# Patient Record
Sex: Male | Born: 1970 | Race: Black or African American | Hispanic: No | Smoking: Never smoker
Health system: Southern US, Community
[De-identification: ages and names within clinical notes are randomized; demographics above are authoritative.]

## PROBLEM LIST (undated history)

## (undated) DIAGNOSIS — I10 Essential (primary) hypertension: Secondary | ICD-10-CM

## (undated) DIAGNOSIS — F419 Anxiety disorder, unspecified: Secondary | ICD-10-CM

## (undated) DIAGNOSIS — F329 Major depressive disorder, single episode, unspecified: Secondary | ICD-10-CM

## (undated) DIAGNOSIS — J45909 Unspecified asthma, uncomplicated: Secondary | ICD-10-CM

## (undated) DIAGNOSIS — I499 Cardiac arrhythmia, unspecified: Secondary | ICD-10-CM

## (undated) DIAGNOSIS — G4730 Sleep apnea, unspecified: Secondary | ICD-10-CM

## (undated) DIAGNOSIS — F32A Depression, unspecified: Secondary | ICD-10-CM

## (undated) HISTORY — DX: Unspecified asthma, uncomplicated: J45.909

## (undated) HISTORY — DX: Cardiac arrhythmia, unspecified: I49.9

## (undated) HISTORY — DX: Anxiety disorder, unspecified: F41.9

## (undated) HISTORY — DX: Sleep apnea, unspecified: G47.30

---

## 2000-03-22 ENCOUNTER — Inpatient Hospital Stay (HOSPITAL_COMMUNITY): Admission: EM | Admit: 2000-03-22 | Discharge: 2000-03-23 | Payer: Self-pay | Admitting: *Deleted

## 2000-09-10 ENCOUNTER — Emergency Department (HOSPITAL_COMMUNITY): Admission: EM | Admit: 2000-09-10 | Discharge: 2000-09-10 | Payer: Self-pay | Admitting: Emergency Medicine

## 2002-10-30 ENCOUNTER — Ambulatory Visit (HOSPITAL_BASED_OUTPATIENT_CLINIC_OR_DEPARTMENT_OTHER): Admission: RE | Admit: 2002-10-30 | Discharge: 2002-10-30 | Payer: Self-pay | Admitting: Internal Medicine

## 2004-01-07 ENCOUNTER — Emergency Department (HOSPITAL_COMMUNITY): Admission: EM | Admit: 2004-01-07 | Discharge: 2004-01-07 | Payer: Self-pay | Admitting: Emergency Medicine

## 2004-06-20 ENCOUNTER — Emergency Department (HOSPITAL_COMMUNITY): Admission: EM | Admit: 2004-06-20 | Discharge: 2004-06-20 | Payer: Self-pay | Admitting: Family Medicine

## 2006-09-12 ENCOUNTER — Encounter: Admission: RE | Admit: 2006-09-12 | Discharge: 2006-09-12 | Payer: Self-pay | Admitting: Internal Medicine

## 2007-08-05 ENCOUNTER — Emergency Department (HOSPITAL_COMMUNITY): Admission: EM | Admit: 2007-08-05 | Discharge: 2007-08-05 | Payer: Self-pay | Admitting: Emergency Medicine

## 2010-08-26 NOTE — H&P (Signed)
[facility]  Patient:    Dylan Patton, Dylan Patton                         MRN: [id number] Adm. Date:  [id number] Attending:  Otilio Saber Dictator:   Young Berry Lorin Picket, R.N., F.N.P.                   Psychiatric Admission Assessment  DATE OF ADMISSION:  March 22, 2000  CHIEF COMPLAINT:  "I was angry and frustrated that my wife was cheating on me and I cut my wrist because I couldnt take it anymore."  PATIENT IDENTIFICATION:  This is a 40 year old black male who is married, voluntary admission to [facility] on March 21, 2000.  HISTORY OF PRESENT ILLNESS:  The patient has been married for four years and learned this past Sunday, March 18, 2000, about infidelity on the part of his wife with two different individuals.  The patient felt stunned and confused.  For the past week he has had uncontrollable crying spells, was unable to sleep more than 1.5 hours each night, and has eaten very little. His frustration built until he decided he could not stand it any longer and he cut at his wrist with the zipper pull from his jacket while he was riding in the car with his wife.  He also, at that time, contemplated throwing himself onto the road from the car.  While he was doing this, his daughter awoke from the back seat and began crying and as he thought about his daughter, he realized that he really did not want to die so he asked his wife to bring him to the hospital.  The patient denies having any homicidal ideations or any hallucinations during this time or previously.  He has no history of previous suicide attempt.  PAST PSYCHIATRIC HISTORY:  The patient is the sole historian here.  The patient has no history of hospitalizations or psychiatric history whatsoever. He denies any history of temper tantrums or distractibility as a child.  He does have periodic problems with anger; he feels shortness of breath when he is very angry and his  thinking gets cloudy at those times.  However, he has always, up until now, been able to maintain control and he has no history of assault or legal problems related to this.  SUBSTANCE ABUSE HISTORY:  The patient drinks about eight to nine beers a week on a social basis.  He used marijuana once Monday for the first time in the past year; was 40 years old when he first use marijuana; in the past has not used it more often than about once or twice a year.  He denies any other use of illegal drugs and denies any abuse of prescription drugs.  PAST MEDICAL HISTORY:  The patient has no primary care Elley Harp.  He was last seen one year ago at an urgent care to be checked for sexually transmitted disease which was negative.  Review of systems reveals history of lumbar strain and a left ACL tear, both injuries from playing football.  The patient wears corrective lenses.  Other than these two things he is healthy and without any illness.  Medications: None.  Drug allergies: None.  Physical examination is pending; currently, his blood pressure is elevated.  SOCIAL HISTORY:  The patient has been married to his wife for four years. This is his first marriage.  He has a 19-year-old daughter  by a previous relationship and he has a 72-year-old daughter by this marriage.  He completed four and a half years of college without a degree and previously has played pro football.  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  He is fairly satisfied with his job.  He reports that there has been poor interaction in his marriage with his wife.  He feels that he has poor communication skills.  He is also frustrated with his wife who has been hospitalized more than once with depression.  They are also under some financial stress because of debts and some legal entanglements based on the financial problems.  Family history: Mother and father have no psychiatric or mental health history  whatsoever.  He has one brother who is living and well and one sister living and well, both of whom have no mental health history.  MENTAL STATUS EXAMINATION:  Alert, relaxed, cooperative black male, attentive to the interviewer with good eye contact.  Speech is normal in rate and tone; it was, however, somewhat circumferential at the start of the interview and became more goal directed as the interview progressed.  Mood was depressed and sad.  Thought process is logical, coherent, and relevant without psychotic symptoms.  Oriented x 3.  Able to contract for safety, no suicidal or homicidal ideation at this time.  ADMISSION DIAGNOSES: Axis I:    1. Depression with suicidal intent.            2. Rule out intermittent explosive disorder. Axis II:   Deferred. Axis III:  1. Elevated blood pressure.            2. Intermittent lumbar pain secondary to sports injury. Axis IV:   Severe; problems related to primary support group and financial            resources. Axis V:    Current 41, past year 21.  INITIAL PLAN OF CARE:  Address this patients depression symptoms and coping skills for dealing with anger and frustration.  Will monitor his blood pressure t.i.d.  ESTIMATED LENGTH OF STAY:  Two to three days. DD:  03/23/00 TD:  03/23/00 Job: [id number]

## 2010-08-26 NOTE — Discharge Summary (Signed)
[facility]  Patient:    Dylan Patton, Dylan Patton                         MRN: [id number] Adm. Date:  [id number] Disc. Date: [id number] Attending:  Otilio Saber                           Discharge Summary  BRIEF HISTORY:  Mr. Arts is a 40 year old black married male admitted with a history of depression and suicidal ideation.  The patient had been married for 4 years and discovered that his wife had been unfaithful with 2 different individuals.  The patient felt stunned and confused.  He had uncontrollable crying spells and was unable to sleep and was eating very little.  His frustration built up to the point he felt he could no longer stand it and he cut his wrist with a zipper pull from his jacket while riding in the car with his wife.  He also contemplated throwing himself on the road from the car.  He states that he realized after his daughter woke up and began crying that he did not want to die and asked his wife to bring him to the hospital.  The patient reported no history of hospitalizations or other psychiatric treatment.  He did report a history of temper tantrums and distractibility as a child and reported periodic problems with anger, feeling short of breath when he felt angry.  He had been able to maintain control, however, in the past.  He did not have a primary care physician.  He had no significant medical problems other than a history of lumbar strain and a left ACL tear, both injuries from playing football.  He was on no medication at the time of admission and had no known drug allergies.  PHYSICAL EXAMINATION:  on admission was normal.  MENTAL STATUS EXAMINATION:  On admission revealed a alert, relaxed, cooperative black male.  Speech was normal.  Thought processes were logical and coherent without evidence of psychosis.  He denied further suicidal ideation.  Mood was depressed and sad.  Affect was sad.  Oriented x 3. Cognitive function was  intact.  ADMITTING DIAGNOSES: Axis I:     1. Depression not otherwise specified.             2. Rule out intermittent explosive disorder.  Axis II:    Deferred. Axis III:   Elevated blood pressure, lumbar pain. Axis IV:    Psychosocial stressors severe. Axis V:     Global assessment of function current is 41, highest past             year is 61.  LABORATORY DATA:  Admission CBC was normal.  Blood chemistries were normal. Thyroid panel was normal.  Urine drug screen was positive only for marijuana. Urinalysis was normal.  HOSPITAL COURSE:  Patient was admitted to [facility] for evaluation of his depression and suicidal ideation.  The patient remained stable in the hospital, sleeping with p.r.n. Restoril.  He denied any suicidal ideation, stating that he thought he was coping better with his marital stressors.  His mood was still mildly depressed but his affect was appropriate and he appeared safe to be managed on an outpatient basis.  CONDITION ON DISCHARGE:  Patient discharged in improved condition, with improvement in his mood and alleviation in his acute suicidal ideation.  He was sleeping and eating better.  DISPOSITION:  Patient discharged home.  He is to follow up with Lawson Radar in my office on March 26, 2000.  DISCHARGE MEDICATIONS:  Restoril 15 mg 1-2 tabs q.h.s. p.r.n.  ADMITTING DIAGNOSES: Axis I:     1. Adjustment disorder with depressed mood. Axis II:    No diagnosis. Axis III:   No diagnosis. Axis IV:    Psychosocial stressors severe. Axis V:     Global assessment of function current is 52, highest past             year is 61. DD:  04/26/00 TD:  04/26/00 Job: [id number]

## 2011-06-13 ENCOUNTER — Encounter (HOSPITAL_COMMUNITY): Payer: Self-pay | Admitting: Emergency Medicine

## 2011-06-13 ENCOUNTER — Emergency Department (HOSPITAL_COMMUNITY): Payer: BC Managed Care – PPO

## 2011-06-13 ENCOUNTER — Other Ambulatory Visit: Payer: Self-pay

## 2011-06-13 ENCOUNTER — Emergency Department (HOSPITAL_COMMUNITY)
Admission: EM | Admit: 2011-06-13 | Discharge: 2011-06-13 | Disposition: A | Discharge status: Home / Self Care | Payer: BC Managed Care – PPO | Attending: Emergency Medicine | Admitting: Emergency Medicine

## 2011-06-13 DIAGNOSIS — F3289 Other specified depressive episodes: Secondary | ICD-10-CM | POA: Insufficient documentation

## 2011-06-13 DIAGNOSIS — S5400XA Injury of ulnar nerve at forearm level, unspecified arm, initial encounter: Secondary | ICD-10-CM | POA: Insufficient documentation

## 2011-06-13 DIAGNOSIS — X58XXXA Exposure to other specified factors, initial encounter: Secondary | ICD-10-CM | POA: Insufficient documentation

## 2011-06-13 DIAGNOSIS — Z7982 Long term (current) use of aspirin: Secondary | ICD-10-CM | POA: Insufficient documentation

## 2011-06-13 DIAGNOSIS — IMO0001 Reserved for inherently not codable concepts without codable children: Secondary | ICD-10-CM | POA: Insufficient documentation

## 2011-06-13 DIAGNOSIS — F329 Major depressive disorder, single episode, unspecified: Secondary | ICD-10-CM | POA: Insufficient documentation

## 2011-06-13 DIAGNOSIS — R209 Unspecified disturbances of skin sensation: Secondary | ICD-10-CM | POA: Insufficient documentation

## 2011-06-13 DIAGNOSIS — R0789 Other chest pain: Secondary | ICD-10-CM | POA: Insufficient documentation

## 2011-06-13 HISTORY — DX: Essential (primary) hypertension: I10

## 2011-06-13 HISTORY — DX: Depression, unspecified: F32.A

## 2011-06-13 HISTORY — DX: Major depressive disorder, single episode, unspecified: F32.9

## 2011-06-13 LAB — COMPREHENSIVE METABOLIC PANEL
AST: 15 U/L (ref 0–37)
CO2: 25 mEq/L (ref 19–32)
Calcium: 9.8 mg/dL (ref 8.4–10.5)
Creatinine, Ser: 1.34 mg/dL (ref 0.50–1.35)
GFR calc Af Amer: 75 mL/min — ABNORMAL LOW (ref >90)
GFR calc non Af Amer: 65 mL/min — ABNORMAL LOW (ref >90)
Total Protein: 7.8 g/dL (ref 6.0–8.3)

## 2011-06-13 LAB — CBC
Hemoglobin: 13.3 g/dL (ref 13.0–17.0)
MCV: 87.9 fL (ref 78.0–100.0)
Platelets: 259 10*3/uL (ref 150–400)
RBC: 4.55 MIL/uL (ref 4.22–5.81)
WBC: 9.2 10*3/uL (ref 4.0–10.5)

## 2011-06-13 LAB — GLUCOSE, CAPILLARY: Glucose-Capillary: 108 mg/dL — ABNORMAL HIGH (ref 70–99)

## 2011-06-13 LAB — POCT I-STAT TROPONIN I: Troponin i, poc: 0 ng/mL (ref 0.00–0.08)

## 2011-06-13 LAB — DIFFERENTIAL
Lymphocytes Relative: 35 % (ref 12–46)
Lymphs Abs: 3.2 10*3/uL (ref 0.7–4.0)
Neutrophils Relative %: 57 % (ref 43–77)

## 2011-06-13 MED ORDER — ASPIRIN 81 MG PO CHEW
243.0000 mg | CHEWABLE_TABLET | Freq: Once | ORAL | Status: AC
  Administered 2011-06-13: 243 mg via ORAL

## 2011-06-13 MED ORDER — METHOCARBAMOL 500 MG PO TABS: 500.0000 mg | ORAL_TABLET | Freq: Two times a day (BID) | ORAL | Status: AC

## 2011-06-13 MED ORDER — HYDROCODONE-ACETAMINOPHEN 5-325 MG PO TABS
1.0000 | ORAL_TABLET | Freq: Once | ORAL | Status: AC
  Administered 2011-06-13: 1 via ORAL
  Filled 2011-06-13 (×2): qty 1

## 2011-06-13 MED ORDER — ASPIRIN 81 MG PO CHEW
CHEWABLE_TABLET | ORAL | Status: AC
  Filled 2011-06-13: qty 3

## 2011-06-13 MED ORDER — METHOCARBAMOL 500 MG PO TABS
1000.0000 mg | ORAL_TABLET | Freq: Once | ORAL | Status: AC
  Administered 2011-06-13: 1000 mg via ORAL
  Filled 2011-06-13: qty 2

## 2011-06-13 NOTE — ED Notes (Signed)
Pt states that since yesterday he has been having left sided chest pain that he describes as stabbing in nature. He states that it goes through to his back. Pt states that the pain is worse with deep breathing. Pt states that he is also having tingling in his left hand that involves his left pinky and ring finger. He took 81mg  asa pta. Pt is alert and oriented. Breath sounds are clear and bowel sounds are present. Pt presented from triage with protocols already in place. md at the bedside to eval.

## 2011-06-13 NOTE — ED Notes (Signed)
Chest pain started yesterda y some nause dizziness and tingling in hands

## 2011-06-13 NOTE — Discharge Instructions (Signed)
Neurapraxia Neurapraxia is a temporary loss of nerve function. It does not cause permanent damage to a nerve. If your foot "falls asleep," that is a type of neurapraxia. It will go away as soon as you start moving your foot. A more serious neurapraxia could take up to 6 weeks to go away. CAUSES   Anything that strains a nerve can cause neurapraxia. The nerve might be stretched or twisted. Something might press or pound on it and cause decreased blood flow to the nerve. Causes of neurapraxia can include:   Bones that break (fracture) or move out of place (dislocation). This can cause the nerves to stretch or twist out of their normal position. This happens most often in the arms and shoulders.   Neck strain when the head moves suddenly, such as in a car crash. This is called whiplash (cervical neurapraxia). It can also happen while playing sports. For example, a football injury called a stinger is a type of neurapraxia. It causes severe pain to shoot down an arm.   Stretching the neck too far from the shoulder. This damages the nerves that go into the shoulder, arm, and hand. It causes numbness and muscle weakness. This condition is called brachial plexus neurapraxia.   Repeated or prolonged pressure can cause decreased blood flow to the nerve (ischemic neurapraxia). Such causes of neurapraxia can include:   A cast or bandage that is too tight around an injured arm or leg. This limits blood flow to the nerves and causes a condition called compartment syndrome. This condition develops when pressure builds up around muscles and nerves.   Infection and disease. This can cut off blood flow to the nerve.   Very cold temperatures.   Sleeping in a position that limits blood flow to your leg or arm.  SYMPTOMS  Numbness and tingling.   Muscle weakness.   Burning pain.   Cool skin.  DIAGNOSIS  Neurapraxia is diagnosed through:  A physical exam. This will include asking questions about your  health. Your caregiver will ask about any symptoms you are having. Your caregiver may also:   Test how strong your muscles are.   Check feeling (sensation) in various areas of your body. A very light touch or pricks with a pin may be used.   Check whether you have signs of nerve damage on one side of the body or both.   Tests such as:   Electromyography (EMG). This test measures electrical activity in a muscle. It shows whether the nerve that supplies the muscle is working.   Nerve conduction studies (NCS). They measure the flow of electricity through a nerve.   Magnetic resonance imaging (MRI). This is a machine that uses magnets and a computer to create pictures of your nerves.  TREATMENT  Treatment aims to ease pain and swelling and to provide support while your body heals.  Medicine may include:   Pain medicine.   Antidepressants.   Seizure medicine.   Medicine to reduce swelling.   For support, options may include:   Braces, walkers, or crutches.   Physical therapy. Having a specialist work with you often speeds healing. It can also help prevent stiffness and future damage.   Surgery. This may be needed if broken or dislocated bones are part of the problem. Surgery also may be done to relieve pressure on nerves or to restore normal blood flow to nerves and muscles.   Electrical stimulators. These devices send pulses of electricity into the muscles. The  aim is to bring back movement.  HOME CARE INSTRUCTIONS What you need to do at home will vary. It will depend on your treatment plan and the type of neurapraxia you have. In general:  Take medicine as told by your caregiver. Follow the directions carefully.   Rest. Give your body time to heal.   Use any splints, braces, or other support devices as directed. If you have questions about their use, ask you caregiver.   Start physical therapy, if that is suggested. Ask if it is okay to practice the exercises at home, too.     If areas of your body are numb, take care to protect them from burns or other injury.   If you had surgery, you will need to care for your surgical cut (incision). Ask for instructions before you leave the hospital.   Keep all follow-up appointments with your caregiver.  SEEK MEDICAL CARE IF:   You have any questions about your medicine.   Numbness or muscle weakness continues.   Pain continues, even after taking pain medicine.  SEEK IMMEDIATE MEDICAL CARE IF:   Your pain suddenly becomes severe.   Numbness or weakness gets much worse.   Your muscles start to twitch or you have muscle spasms.   You have a fever.  Document Released: 08/29/2010 Document Revised: 03/16/2011 Document Reviewed: 08/29/2010 [facility] Patient Information 2012 [place], [place].

## 2011-06-13 NOTE — ED Notes (Signed)
Pt stated that he has been having left sided sharp chest pain since yesterday. Pain is worst with deep breaths. No nausea or vomiting. Pain radiates to his back. Lung sounds are clear.  Pt also complaining of left arm tingling and numbness that goes all the way to left fingers. Pt able to move fingers. Brisk capillary refill. Arm and hand warm to touch and pt feels me touching. Will continue to monitor.

## 2011-06-13 NOTE — ED Provider Notes (Signed)
History     CSN: [id number]  Arrival date & time 06/13/11  1512   First MD Initiated Contact with Patient 06/13/11 1731      Chief Complaint  Patient presents with  . Chest Pain    (Consider location/radiation/quality/duration/timing/severity/associated sxs/prior treatment) HPI Pt reports sleeping on his Left side last night and waking with tingling in his 4th and 5th digits of his left hand with L pectoralis pain reproduced when moving. Pain has been constant throughout the day. No fever, chills, SOB, LE swelling or pain. No trauma.  Past Medical History  Diagnosis Date  . Hypertension   . Diabetes mellitus   . Depression     History reviewed. No pertinent past surgical history.  No family history on file.  History  Substance Use Topics  . Smoking status: Never Smoker   . Smokeless tobacco: Not on file  . Alcohol Use: Yes      Review of Systems  Constitutional: Negative for fever and chills.  HENT: Negative for neck pain and neck stiffness.   Respiratory: Negative for chest tightness and shortness of breath.   Cardiovascular: Positive for chest pain (sharp). Negative for palpitations and leg swelling.  Gastrointestinal: Negative for nausea, vomiting and abdominal pain.  Musculoskeletal: Positive for myalgias. Negative for joint swelling.  Skin: Negative for color change, pallor, rash and wound.  Neurological: Negative for dizziness, weakness, numbness and headaches.    Allergies  Review of patient's allergies indicates no known allergies.  Home Medications   Current Outpatient Rx  Name Route Sig Dispense Refill  . ASPIRIN 81 MG PO CHEW Oral Chew 81 mg by mouth daily.    Marland Kitchen FLUOXETINE HCL 20 MG PO CAPS Oral Take 20 mg by mouth daily.    Marland Kitchen GLIPIZIDE 10 MG PO TABS Oral Take 10 mg by mouth 2 (two) times daily before a meal.    . HYDROCHLOROTHIAZIDE 12.5 MG PO CAPS Oral Take 12.5 mg by mouth daily.    Marland Kitchen LISINOPRIL 20 MG PO TABS Oral Take 20 mg by mouth daily.      Marland Kitchen METFORMIN HCL 500 MG PO TABS Oral Take 500 mg by mouth 2 (two) times daily with a meal.    . SIMVASTATIN 20 MG PO TABS Oral Take 20 mg by mouth every evening.    Marland Kitchen SITAGLIPTIN PHOSPHATE 100 MG PO TABS Oral Take 100 mg by mouth daily.    Marland Kitchen METHOCARBAMOL 500 MG PO TABS Oral Take 1 tablet (500 mg total) by mouth 2 (two) times daily. 20 tablet 0    BP 123/77  Pulse 96  Temp 98.2 F (36.8 C)  Resp 17  Ht 6\' 2"  (1.88 m)  Wt 297 lb (134.718 kg)  BMI 38.13 kg/m2  SpO2 98%  Physical Exam  Nursing note and vitals reviewed. Constitutional: He is oriented to person, place, and time. He appears well-developed and well-nourished. No distress.  HENT:  Head: Normocephalic and atraumatic.  Mouth/Throat: Oropharynx is clear and moist.  Eyes: EOM are normal. Pupils are equal, round, and reactive to light.  Neck: Normal range of motion. Neck supple.  Cardiovascular: Normal rate and regular rhythm.   Pulmonary/Chest: Effort normal and breath sounds normal. No respiratory distress. He has no wheezes. He has no rales. He exhibits tenderness.       Pain reproduced completely when palpating L pectoralis and with ROM at L shoulder.   Abdominal: Soft. Bowel sounds are normal. He exhibits no distension and no mass. There is  no tenderness. There is no rebound and no guarding.  Musculoskeletal: Normal range of motion. He exhibits tenderness. He exhibits no edema.  Neurological: He is alert and oriented to person, place, and time.       Pt has tingling sensation in the Ulnar nerve distribution of L hand. Neg Phalen or Tinel. 5/5 motor  Skin: Skin is warm and dry. No rash noted. No erythema.  Psychiatric: He has a normal mood and affect. His behavior is normal.    ED Course  Procedures (including critical care time)  Labs Reviewed  COMPREHENSIVE METABOLIC PANEL - Abnormal; Notable for the following:    Glucose, Bld 114 (*)    GFR calc non Af Amer 65 (*)    GFR calc Af Amer 75 (*)    All other  components within normal limits  GLUCOSE, CAPILLARY - Abnormal; Notable for the following:    Glucose-Capillary 108 (*)    All other components within normal limits  TROPONIN I  CBC  DIFFERENTIAL  POCT I-STAT TROPONIN I   Dg Chest 2 View  06/13/2011  *RADIOLOGY REPORT*  Clinical Data: Left-sided chest pain, shortness of breath  CHEST - 2 VIEW  Comparison: None.  Findings: The lungs are clear.  Mediastinal contours are normal. The heart is within normal limits in size.  No bony abnormality is seen.  IMPRESSION: No active lung disease.  Original Report Authenticated By: Juline Patch, M.D.     1. Musculoskeletal chest pain   2. Neuropraxia of ulnar      Date: 06/13/2011  Rate: 82  Rhythm: normal sinus rhythm  QRS Axis: normal  Intervals: normal  ST/T Wave abnormalities: normal  Conduction Disutrbances:none  Narrative Interpretation:   Old EKG Reviewed: none available   MDM   Pt states he is feeling better after Robaxin. F/u with PMD in 2 days. Return for worsening symptoms or concerns.         Loren Racer, MD 06/13/11 2003

## 2013-04-17 ENCOUNTER — Encounter (HOSPITAL_COMMUNITY): Payer: Self-pay | Admitting: Emergency Medicine

## 2013-04-17 ENCOUNTER — Encounter: Payer: Self-pay | Admitting: Gastroenterology

## 2013-04-17 ENCOUNTER — Emergency Department (HOSPITAL_COMMUNITY)
Admission: EM | Admit: 2013-04-17 | Discharge: 2013-04-17 | Disposition: A | Discharge status: Home / Self Care | Payer: BC Managed Care – PPO | Attending: Emergency Medicine | Admitting: Emergency Medicine

## 2013-04-17 DIAGNOSIS — F3289 Other specified depressive episodes: Secondary | ICD-10-CM | POA: Insufficient documentation

## 2013-04-17 DIAGNOSIS — Z880 Allergy status to penicillin: Secondary | ICD-10-CM | POA: Insufficient documentation

## 2013-04-17 DIAGNOSIS — R42 Dizziness and giddiness: Secondary | ICD-10-CM | POA: Insufficient documentation

## 2013-04-17 DIAGNOSIS — E119 Type 2 diabetes mellitus without complications: Secondary | ICD-10-CM | POA: Insufficient documentation

## 2013-04-17 DIAGNOSIS — Z79899 Other long term (current) drug therapy: Secondary | ICD-10-CM | POA: Insufficient documentation

## 2013-04-17 DIAGNOSIS — F329 Major depressive disorder, single episode, unspecified: Secondary | ICD-10-CM | POA: Insufficient documentation

## 2013-04-17 DIAGNOSIS — R112 Nausea with vomiting, unspecified: Secondary | ICD-10-CM | POA: Insufficient documentation

## 2013-04-17 DIAGNOSIS — Z7982 Long term (current) use of aspirin: Secondary | ICD-10-CM | POA: Insufficient documentation

## 2013-04-17 DIAGNOSIS — I10 Essential (primary) hypertension: Secondary | ICD-10-CM | POA: Insufficient documentation

## 2013-04-17 DIAGNOSIS — K922 Gastrointestinal hemorrhage, unspecified: Secondary | ICD-10-CM | POA: Insufficient documentation

## 2013-04-17 LAB — COMPREHENSIVE METABOLIC PANEL
ALBUMIN: 3.9 g/dL (ref 3.5–5.2)
ALT: 18 U/L (ref 0–53)
AST: 23 U/L (ref 0–37)
Alkaline Phosphatase: 61 U/L (ref 39–117)
BILIRUBIN TOTAL: 0.3 mg/dL (ref 0.3–1.2)
BUN: 14 mg/dL (ref 6–23)
CALCIUM: 9.1 mg/dL (ref 8.4–10.5)
CHLORIDE: 95 meq/L — AB (ref 96–112)
CO2: 25 mEq/L (ref 19–32)
CREATININE: 1.2 mg/dL (ref 0.50–1.35)
GFR calc Af Amer: 85 mL/min — ABNORMAL LOW (ref >90)
GFR calc non Af Amer: 73 mL/min — ABNORMAL LOW (ref >90)
Glucose, Bld: 223 mg/dL — ABNORMAL HIGH (ref 70–99)
Potassium: 5.3 mEq/L (ref 3.7–5.3)
Sodium: 133 mEq/L — ABNORMAL LOW (ref 137–147)
Total Protein: 7.3 g/dL (ref 6.0–8.3)

## 2013-04-17 LAB — ABO/RH: ABO/RH(D): O POS

## 2013-04-17 LAB — CBC WITH DIFFERENTIAL/PLATELET
BASOS ABS: 0 10*3/uL (ref 0.0–0.1)
BASOS PCT: 0 % (ref 0–1)
EOS ABS: 0.1 10*3/uL (ref 0.0–0.7)
EOS PCT: 1 % (ref 0–5)
HEMATOCRIT: 35.8 % — AB (ref 39.0–52.0)
HEMOGLOBIN: 12 g/dL — AB (ref 13.0–17.0)
Lymphocytes Relative: 33 % (ref 12–46)
Lymphs Abs: 3 10*3/uL (ref 0.7–4.0)
MCH: 29.1 pg (ref 26.0–34.0)
MCHC: 33.5 g/dL (ref 30.0–36.0)
MCV: 86.7 fL (ref 78.0–100.0)
MONO ABS: 0.5 10*3/uL (ref 0.1–1.0)
MONOS PCT: 5 % (ref 3–12)
Neutro Abs: 5.4 10*3/uL (ref 1.7–7.7)
Neutrophils Relative %: 60 % (ref 43–77)
Platelets: 242 10*3/uL (ref 150–400)
RBC: 4.13 MIL/uL — ABNORMAL LOW (ref 4.22–5.81)
RDW: 13.4 % (ref 11.5–15.5)
WBC: 9 10*3/uL (ref 4.0–10.5)

## 2013-04-17 LAB — OCCULT BLOOD, POC DEVICE: Fecal Occult Bld: POSITIVE — AB

## 2013-04-17 LAB — LIPASE, BLOOD: LIPASE: 59 U/L (ref 11–59)

## 2013-04-17 LAB — TYPE AND SCREEN
ABO/RH(D): O POS
Antibody Screen: NEGATIVE

## 2013-04-17 LAB — TROPONIN I: Troponin I: <0.3 ng/mL (ref <0.30)

## 2013-04-17 MED ORDER — MORPHINE SULFATE 4 MG/ML IJ SOLN
4.0000 mg | Freq: Once | INTRAMUSCULAR | Status: AC
  Administered 2013-04-17: 4 mg via INTRAVENOUS
  Filled 2013-04-17: qty 1

## 2013-04-17 MED ORDER — SODIUM CHLORIDE 0.9 % IV BOLUS (SEPSIS)
1000.0000 mL | Freq: Once | INTRAVENOUS | Status: AC
  Administered 2013-04-17: 1000 mL via INTRAVENOUS

## 2013-04-17 MED ORDER — ONDANSETRON HCL 4 MG/2ML IJ SOLN
4.0000 mg | Freq: Once | INTRAMUSCULAR | Status: AC
  Administered 2013-04-17: 4 mg via INTRAVENOUS
  Filled 2013-04-17: qty 2

## 2013-04-17 MED ORDER — SODIUM CHLORIDE 0.9 % IV SOLN
80.0000 mg | Freq: Once | INTRAVENOUS | Status: AC
  Administered 2013-04-17: 80 mg via INTRAVENOUS
  Filled 2013-04-17: qty 80

## 2013-04-17 MED ORDER — ONDANSETRON 4 MG PO TBDP: 4.0000 mg | ORAL_TABLET | Freq: Three times a day (TID) | ORAL | Status: AC | PRN

## 2013-04-17 MED ORDER — PANTOPRAZOLE SODIUM 20 MG PO TBEC: 20.0000 mg | DELAYED_RELEASE_TABLET | Freq: Every day | ORAL | Status: AC

## 2013-04-17 NOTE — ED Notes (Signed)
Patient is resting comfortably. 

## 2013-04-17 NOTE — ED Provider Notes (Signed)
CSN: [id number]     Arrival date & time 04/17/13  0847 History   First MD Initiated Contact with Patient 04/17/13 0848     Chief Complaint  Patient presents with  . Abdominal Pain   (Consider location/radiation/quality/duration/timing/severity/associated sxs/prior Treatment) Patient is a 43 y.o. male presenting with abdominal pain. The history is provided by the patient and medical records.  Abdominal Pain  This is a 43 year old male with past medical history significant for hypertension, diabetes, depression, presenting to the ED for left upper quadrant abdominal pain, nausea, and vomiting for 1 day. Emesis described as dark with a coffee-ground appearance. Has been able to tolerate some PO gatorade this morning.  Patient notes stools have also appeared black and tarry in color but denies any frank blood. No diarrhea.  Endorses some lightheadedness when standing and walking, none when lying still. no dizziness or syncopal episodes.  No chest pain, SOB, or palpitations.  No prior history of GI bleed. Patient on daily 81mg  ASA, no other anticoagulants. Patient has never had a colonoscopy, no family history of colon cancer.    Past Medical History  Diagnosis Date  . Hypertension   . Diabetes mellitus   . Depression    History reviewed. No pertinent past surgical history. History reviewed. No pertinent family history. History  Substance Use Topics  . Smoking status: Never Smoker   . Smokeless tobacco: Not on file  . Alcohol Use: Yes    Review of Systems  Gastrointestinal: Positive for abdominal pain.  All other systems reviewed and are negative.    Allergies  Penicillins  Home Medications   Current Outpatient Rx  Name  Route  Sig  Dispense  Refill  . aspirin 81 MG chewable tablet   Oral   Chew 81 mg by mouth daily.         Marland Kitchen. FLUoxetine (PROZAC) 20 MG capsule   Oral   Take 20 mg by mouth daily.         Marland Kitchen. glipiZIDE (GLUCOTROL) 10 MG tablet   Oral   Take 10 mg by mouth  2 (two) times daily before a meal.         . hydrochlorothiazide (MICROZIDE) 12.5 MG capsule   Oral   Take 12.5 mg by mouth daily.         Marland Kitchen. lisinopril (PRINIVIL,ZESTRIL) 20 MG tablet   Oral   Take 20 mg by mouth daily.         . metFORMIN (GLUCOPHAGE) 500 MG tablet   Oral   Take 500 mg by mouth 2 (two) times daily with a meal.         . simvastatin (ZOCOR) 20 MG tablet   Oral   Take 20 mg by mouth every evening.         . sitaGLIPtin (JANUVIA) 100 MG tablet   Oral   Take 100 mg by mouth daily.          BP 164/92  Pulse 90  Temp(Src) 98.6 F (37 C) (Oral)  Resp 26  SpO2 100%  Physical Exam  Nursing note and vitals reviewed. Constitutional: He is oriented to person, place, and time. He appears well-developed and well-nourished. No distress.  HENT:  Head: Normocephalic and atraumatic.  Mouth/Throat: Oropharynx is clear and moist.  No blood in posterior oropharynx  Eyes: Conjunctivae and EOM are normal. Pupils are equal, round, and reactive to light.  Neck: Normal range of motion. Neck supple.  Cardiovascular: Normal rate, regular rhythm and  normal heart sounds.   Pulmonary/Chest: Effort normal and breath sounds normal. No respiratory distress. He has no wheezes.  Abdominal: Soft. Bowel sounds are normal. There is tenderness. There is no guarding.  Abdomen soft, non-distended, LUQ TTP  Genitourinary: Rectal exam shows no external hemorrhoid, no internal hemorrhoid, no fissure, no mass, no tenderness and anal tone normal. Guaiac positive stool.  Melanotic stool on glove, no frank blood  Musculoskeletal: Normal range of motion.  Neurological: He is alert and oriented to person, place, and time.  Skin: Skin is warm and dry. He is not diaphoretic.  Psychiatric: He has a normal mood and affect.    ED Course  Procedures (including critical care time) Labs Review Labs Reviewed  CBC WITH DIFFERENTIAL - Abnormal; Notable for the following:    RBC 4.13 (*)     Hemoglobin 12.0 (*)    HCT 35.8 (*)    All other components within normal limits  COMPREHENSIVE METABOLIC PANEL - Abnormal; Notable for the following:    Glucose, Bld 223 (*)    GFR calc non Af Amer 73 (*)    GFR calc Af Amer 85 (*)    All other components within normal limits  OCCULT BLOOD, POC DEVICE - Abnormal; Notable for the following:    Fecal Occult Bld POSITIVE (*)    All other components within normal limits  LIPASE, BLOOD  TROPONIN I  TYPE AND SCREEN  ABO/RH   Imaging Review No results found.  EKG Interpretation   None       MDM   1. Upper GI bleed   2. Nausea and vomiting    Pt with sx suspicious for upper GI bleed.  Will obtain basic labs, type and screen.  IVFB, protonix, and anti-emetics given.  FOBT +.  Labs reassuring-- H/H stable.  Pt has had no further vomiting in the ED, tolerated PO without difficulty.  NG tube inserted and irrigated with return of clear saline, no active bleeding at this time.  Repeat abdominal exam improved, pt has ambulated around the ED without recurrent lightheadedness, dizziness, or feelings of syncope.  Discussed case with Dr. Blinda Leatherwood-- feels it is ok for pt to FU with GI on an OP basis.  Pt afebrile, non-toxic appearing, NAD, VS stable- ok for discharge.  Referral to GI provided.  Rx protonix and zofran.  Signs/sx that would warrant ED return including recurrent coffee ground/bright red bloody emesis, bloody stools, dizziness, weakness, or syncopal episodes-- pt acknowledged understanding and agreed.  Garlon Hatchet, PA-C 04/17/13 1210

## 2013-04-17 NOTE — ED Notes (Signed)
Pt started having abd pain on left side yesterday. Pt is nauseous, dizzy, made himself vomit yesterday. His vomit dark black and grainy.

## 2013-04-17 NOTE — Discharge Instructions (Signed)
Take the prescribed medication as directed. Follow-up with [facility]-- call their office to schedule an appt. Return to the ED for new or worsening symptoms-- recurrence of bloody emesis/stool, dizziness, weakness, etc.

## 2013-04-17 NOTE — ED Notes (Signed)
Placed NG tube in right nare and irrigated with sterile saline per order. Small bits of bright red blood otherwise clear liquid. PA viewed specimen. Removed NG tube per order.

## 2013-04-22 NOTE — ED Provider Notes (Signed)
Medical screening examination/treatment/procedure(s) were performed by non-physician practitioner and as supervising physician I was immediately available for consultation/collaboration.  Gilda Creasehristopher J. Pollina, MD 04/22/13 [phone]

## 2013-05-06 ENCOUNTER — Ambulatory Visit (INDEPENDENT_AMBULATORY_CARE_PROVIDER_SITE_OTHER): Payer: BC Managed Care – PPO | Admitting: Gastroenterology

## 2013-05-06 ENCOUNTER — Encounter: Payer: Self-pay | Admitting: Gastroenterology

## 2013-05-06 VITALS — BP 128/80 | HR 80 | Ht 73.0 in | Wt 327.0 lb

## 2013-05-06 DIAGNOSIS — K922 Gastrointestinal hemorrhage, unspecified: Secondary | ICD-10-CM

## 2013-05-06 DIAGNOSIS — R1012 Left upper quadrant pain: Secondary | ICD-10-CM

## 2013-05-06 NOTE — Patient Instructions (Addendum)
You will be set up for an upper endoscopy for LUQ pain, vomiting, minor GI bleeding. For now, stay on protonix once daily.

## 2013-05-06 NOTE — Progress Notes (Signed)
HPI: This is a   very pleasant 43 year old man whom I am meeting for the first time today.  e was in the emergency room earlier this month with abdominal pain, vomiting and some coffee-ground emesis. CBC showed hemoglobin in the 12.5 range. BUN and creatinine were normal, platelets were normal. His vomiting stopped while in the emergency room with IV fluid hydration and antiemetics. An NG tube was briefly placed and it returned clear, nonbloody gastric secretions. He was put on proton pump inhibitor and set up for this outpatient GI evaluation.  Knife like LUQ pain for 12 hours, nausea, vomiting (once, was dark).  Tried pepto   In ER, pain meds, antinausea meds and he felt better by time he left.  Was taking bayer ES once per week.  Also tylenol.  No sick contacts.  No associated diarrhea.  Since ER, still has slight pain in LUQ but not nearly as bad.  Weight stable.     Review of systems: Pertinent positive and negative review of systems were noted in the above HPI section. Complete review of systems was performed and was otherwise normal.    Past Medical History  Diagnosis Date  . Hypertension   . Diabetes mellitus   . Depression   . Anxiety   . Cardiac arrhythmia   . Asthma   . Sleep apnea     History reviewed. No pertinent past surgical history.  Current Outpatient Prescriptions  Medication Sig Dispense Refill  . aspirin 81 MG chewable tablet Chew 81 mg by mouth daily.      Marland Kitchen. glipiZIDE (GLUCOTROL) 10 MG tablet Take 10 mg by mouth 2 (two) times daily before a meal.      . hydrochlorothiazide (MICROZIDE) 12.5 MG capsule Take 25 mg by mouth daily.       Marland Kitchen. lisinopril (PRINIVIL,ZESTRIL) 40 MG tablet Take 40 mg by mouth daily.      . metFORMIN (GLUCOPHAGE) 500 MG tablet Take 500 mg by mouth 2 (two) times daily with a meal.      . ondansetron (ZOFRAN ODT) 4 MG disintegrating tablet Take 1 tablet (4 mg total) by mouth every 8 (eight) hours as needed for nausea.  10 tablet   0  . OSENI 25-30 MG TABS Take 1 tablet by mouth at bedtime.       . pantoprazole (PROTONIX) 20 MG tablet Take 1 tablet (20 mg total) by mouth daily.  30 tablet  0   No current facility-administered medications for this visit.    Allergies as of 05/06/2013 - Review Complete 05/06/2013  Allergen Reaction Noted  . Penicillins  04/17/2013    Family History  Problem Relation Age of Onset  . Diabetes Father   . Heart disease Father   . Kidney disease Father     History   Social History  . Marital Status: Divorced    Spouse Name: N/A    Number of Children: 3  . Years of Education: N/A   Occupational History  . teacher's aide [employer]   Social History Main Topics  . Smoking status: Never Smoker   . Smokeless tobacco: Never Used  . Alcohol Use: Yes     Comment: occasional  . Drug Use: No  . Sexual Activity: Not on file   Other Topics Concern  . Not on file   Social History Narrative  . No narrative on file       Physical Exam: Ht 6\' 1"  (1.854 m)  Wt 327  lb (148.326 kg)  BMI 43.15 kg/m2 Constitutional: generally well-appearing Psychiatric: alert and oriented x3 Eyes: extraocular movements intact Mouth: oral pharynx moist, no lesions Neck: supple no lymphadenopathy Cardiovascular: heart regular rate and rhythm Lungs: clear to auscultation bilaterally Abdomen: soft, nontender, nondistended, no obvious ascites, no peritoneal signs, normal bowel sounds Extremities: no lower extremity edema bilaterally Skin: no lesions on visible extremities    Assessment and plan: 43 y.o. male with  left upper quadrant pain, vomiting  I did not mention above that he has not been having any melenic stools. The left upper quadrant pain may have been from gastroenteritis, perhaps peptic ulcer disease. Is not take NSAIDs on a very frequent basis. Perhaps his underlying H. pylori. Plan to proceed with EGD at his soonest convenience. He shows no sign of any ongoing GI  bleeding and I see no reason for a further blood tests or imaging studies at this point. He will stand proton pump inhibitor once daily until that time of his EGD.

## 2013-05-07 ENCOUNTER — Encounter: Payer: Self-pay | Admitting: Gastroenterology

## 2013-05-19 ENCOUNTER — Encounter: Payer: Self-pay | Admitting: Gastroenterology

## 2013-05-19 ENCOUNTER — Ambulatory Visit (AMBULATORY_SURGERY_CENTER): Payer: BC Managed Care – PPO | Admitting: Gastroenterology

## 2013-05-19 VITALS — BP 135/78 | HR 77 | Temp 98.5°F | Resp 21 | Ht 73.0 in | Wt 327.0 lb

## 2013-05-19 DIAGNOSIS — K922 Gastrointestinal hemorrhage, unspecified: Secondary | ICD-10-CM

## 2013-05-19 LAB — GLUCOSE, CAPILLARY
GLUCOSE-CAPILLARY: 108 mg/dL — AB (ref 70–99)
Glucose-Capillary: 95 mg/dL (ref 70–99)

## 2013-05-19 MED ORDER — SODIUM CHLORIDE 0.9 % IV SOLN: 500.0000 mL | INTRAVENOUS | Status: DC

## 2013-05-19 NOTE — Patient Instructions (Signed)
Discharge instructions given with verbal understanding. Normal exam. Resume previous medications. YOU HAD AN ENDOSCOPIC PROCEDURE TODAY AT THE [facility]: Refer to the procedure report that was given to you for any specific questions about what was found during the examination.  If the procedure report does not answer your questions, please call your gastroenterologist to clarify.  If you requested that your care partner not be given the details of your procedure findings, then the procedure report has been included in a sealed envelope for you to review at your convenience later.  YOU SHOULD EXPECT: Some feelings of bloating in the abdomen. Passage of more gas than usual.  Walking can help get rid of the air that was put into your GI tract during the procedure and reduce the bloating. If you had a lower endoscopy (such as a colonoscopy or flexible sigmoidoscopy) you may notice spotting of blood in your stool or on the toilet paper. If you underwent a bowel prep for your procedure, then you may not have a normal bowel movement for a few days.  DIET: Your first meal following the procedure should be a light meal and then it is ok to progress to your normal diet.  A half-sandwich or bowl of soup is an example of a good first meal.  Heavy or fried foods are harder to digest and may make you feel nauseous or bloated.  Likewise meals heavy in dairy and vegetables can cause extra gas to form and this can also increase the bloating.  Drink plenty of fluids but you should avoid alcoholic beverages for 24 hours.  ACTIVITY: Your care partner should take you home directly after the procedure.  You should plan to take it easy, moving slowly for the rest of the day.  You can resume normal activity the day after the procedure however you should NOT DRIVE or use heavy machinery for 24 hours (because of the sedation medicines used during the test).    SYMPTOMS TO REPORT IMMEDIATELY: A gastroenterologist  can be reached at any hour.  During normal business hours, 8:30 AM to 5:00 PM Monday through Friday, call [phone].  After hours and on weekends, please call the GI answering service at [phone] who will take a message and have the physician on call contact you.   Following upper endoscopy (EGD)  Vomiting of blood or coffee ground material  New chest pain or pain under the shoulder blades  Painful or persistently difficult swallowing  New shortness of breath  Fever of 100F or higher  Black, tarry-looking stools  FOLLOW UP: If any biopsies were taken you will be contacted by phone or by letter within the next 1-3 weeks.  Call your gastroenterologist if you have not heard about the biopsies in 3 weeks.  Our staff will call the home number listed on your records the next business day following your procedure to check on you and address any questions or concerns that you may have at that time regarding the information given to you following your procedure. This is a courtesy call and so if there is no answer at the home number and we have not heard from you through the emergency physician on call, we will assume that you have returned to your regular daily activities without incident.  SIGNATURES/CONFIDENTIALITY: You and/or your care partner have signed paperwork which will be entered into your electronic medical record.  These signatures attest to the fact that that the information above on your After   Visit Summary has been reviewed and is understood.  Full responsibility of the confidentiality of this discharge information lies with you and/or your care-partner. 

## 2013-05-19 NOTE — Op Note (Signed)
[facility] [address] [place], [id number]   ENDOSCOPY PROCEDURE REPORT  PATIENT: Ardeth SportsmanMorris, Dylan C.  MR#: [id number] BIRTHDATE: April 26, 1970 , 42  yrs. old GENDER: Male ENDOSCOPIST: Rachael Feeaniel P Jacobs, MD PROCEDURE DATE:  05/19/2013 PROCEDURE:  EGD, diagnostic ASA CLASS:     Class II INDICATIONS:  minor hematemesis (coffee ground emesis, no anemia, resolved). MEDICATIONS: Fentanyl 50 mcg IV, Versed 7 mg IV, and These medications were titrated to patient response per physician's verbal order TOPICAL ANESTHETIC: Cetacaine Spray  DESCRIPTION OF PROCEDURE: After the risks benefits and alternatives of the procedure were thoroughly explained, informed consent was obtained.  The LB [id number] endoscope was introduced through the mouth and advanced to the second portion of the duodenum. Without limitations.  The instrument was slowly withdrawn as the mucosa was fully examined.     The upper, middle and distal third of the esophagus were carefully inspected and no abnormalities were noted.  The z-line was well seen at the GEJ.  The endoscope was pushed into the fundus which was normal including a retroflexed view.  The antrum, gastric body, first and second part of the duodenum were unremarkable. Retroflexed views revealed no abnormalities.     The scope was then withdrawn from the patient and the procedure completed.  COMPLICATIONS: There were no complications. ENDOSCOPIC IMPRESSION: Normal EGD  RECOMMENDATIONS: It is safe for you to wean off the antiacid medicine (pantoprazole). Take one pill every other day for the next week, then stop completely.   eSigned:  Rachael Feeaniel P Jacobs, MD 05/19/2013 3:27 PM

## 2013-05-20 ENCOUNTER — Telehealth: Payer: Self-pay | Admitting: *Deleted

## 2013-05-20 NOTE — Telephone Encounter (Signed)
No answer, message left for the patient. 

## 2017-07-03 ENCOUNTER — Other Ambulatory Visit: Payer: Self-pay

## 2017-07-03 ENCOUNTER — Emergency Department (HOSPITAL_COMMUNITY)
Admission: EM | Admit: 2017-07-03 | Discharge: 2017-07-03 | Disposition: A | Discharge status: Home / Self Care | Payer: BC Managed Care – PPO | Attending: Emergency Medicine | Admitting: Emergency Medicine

## 2017-07-03 ENCOUNTER — Encounter (HOSPITAL_COMMUNITY): Payer: Self-pay | Admitting: *Deleted

## 2017-07-03 DIAGNOSIS — Z79899 Other long term (current) drug therapy: Secondary | ICD-10-CM | POA: Diagnosis not present

## 2017-07-03 DIAGNOSIS — Z7984 Long term (current) use of oral hypoglycemic drugs: Secondary | ICD-10-CM | POA: Diagnosis not present

## 2017-07-03 DIAGNOSIS — H1045 Other chronic allergic conjunctivitis: Secondary | ICD-10-CM | POA: Insufficient documentation

## 2017-07-03 DIAGNOSIS — I10 Essential (primary) hypertension: Secondary | ICD-10-CM | POA: Diagnosis not present

## 2017-07-03 DIAGNOSIS — E119 Type 2 diabetes mellitus without complications: Secondary | ICD-10-CM | POA: Insufficient documentation

## 2017-07-03 DIAGNOSIS — J45909 Unspecified asthma, uncomplicated: Secondary | ICD-10-CM | POA: Insufficient documentation

## 2017-07-03 DIAGNOSIS — Z7982 Long term (current) use of aspirin: Secondary | ICD-10-CM | POA: Diagnosis not present

## 2017-07-03 DIAGNOSIS — H1013 Acute atopic conjunctivitis, bilateral: Secondary | ICD-10-CM

## 2017-07-03 DIAGNOSIS — H5789 Other specified disorders of eye and adnexa: Secondary | ICD-10-CM | POA: Diagnosis present

## 2017-07-03 MED ORDER — TETRACAINE HCL 0.5 % OP SOLN
1.0000 [drp] | Freq: Once | OPHTHALMIC | Status: AC
  Administered 2017-07-03: 1 [drp] via OPHTHALMIC
  Filled 2017-07-03: qty 4

## 2017-07-03 MED ORDER — FLUORESCEIN SODIUM 1 MG OP STRP
1.0000 | ORAL_STRIP | Freq: Once | OPHTHALMIC | Status: AC
  Administered 2017-07-03: 1 via OPHTHALMIC
  Filled 2017-07-03: qty 1

## 2017-07-03 MED ORDER — KETOTIFEN FUMARATE 0.025 % OP SOLN: 1.0000 [drp] | Freq: Two times a day (BID) | OPHTHALMIC | 0 refills | Status: AC

## 2017-07-03 NOTE — ED Triage Notes (Signed)
To ED for eval of bilateral eye pain, swelling, and redness for past month. Treated for pink eye and finished meds 4-5 days ago. Drainage noted.

## 2017-07-03 NOTE — ED Provider Notes (Signed)
[facility] Provider Note   CSN: [id number] Arrival date & time: 07/03/17  0631     History   Chief Complaint Chief Complaint  Patient presents with  . Eye Pain    HPI Ardeth SportsmanJames C Patton is a 47 y.o. male.  HPI   Pt is a 47 year old male who presents to the ED today complaining of bilateral eye irritation, redness, itching that has been present for about 2 weeks.  States that he has irritation to the inner part of both eyes on the skin and rates this pain 8/10.  Denies that he has pain to the eyes or pain with eye movement.  Patient was recently seen by his PCP on 3/11 and diagnosed with bacterial conjunctivitis.  He was started on ophthalmic antibiotics.  States that drainage has improved since then, however he has continued to have irritated and itchy eyes.  Has tried using Visine with no relief.  He also reports associated sneezing, rhinorrhea, nasal congestion.  States he has a h/o of seasonal allergies but they have not been this bad before.  Denies any fevers.  Denies any changes in his vision. Reports that the skin around his eyes is sensitive, dry and itchy.  Has a H/o ocular hypertension and was last seen by ophthalmologist on 1/19. Is supposed to take latanoprost, but has not been taking it for last 5 days due to bilat eye irritation. Next ophtho appt is August 2019.   Past Medical History:  Diagnosis Date  . Anxiety   . Asthma   . Cardiac arrhythmia   . Depression   . Diabetes mellitus   . Hypertension   . Sleep apnea     There are no active problems to display for this patient.   History reviewed. No pertinent surgical history.      Home Medications    Prior to Admission medications   Medication Sig Start Date End Date Taking? Authorizing Provider  aspirin 81 MG chewable tablet Chew 81 mg by mouth daily.    [provider]  glipiZIDE (GLUCOTROL) 10 MG tablet Take 10 mg by mouth 2 (two) times daily before a meal.     [provider]  hydrochlorothiazide (MICROZIDE) 12.5 MG capsule Take 25 mg by mouth daily.     [provider]  ketotifen (ZADITOR) 0.025 % ophthalmic solution Place 1 drop into both eyes every 12 (twelve) hours for 5 days. 07/03/17 07/08/17  Ladawna Walgren S, PA-C  lisinopril (PRINIVIL,ZESTRIL) 40 MG tablet Take 40 mg by mouth daily.    [provider]  metFORMIN (GLUCOPHAGE) 500 MG tablet Take 500 mg by mouth 2 (two) times daily with a meal.    [provider]  ondansetron (ZOFRAN ODT) 4 MG disintegrating tablet Take 1 tablet (4 mg total) by mouth every 8 (eight) hours as needed for nausea. 04/17/13   Garlon HatchetSanders, Lisa M, PA-C  OSENI 25-30 MG TABS Take 1 tablet by mouth at bedtime.  04/21/13   [provider]  pantoprazole (PROTONIX) 20 MG tablet Take 1 tablet (20 mg total) by mouth daily. 04/17/13   Garlon HatchetSanders, Lisa M, PA-C  simvastatin (ZOCOR) 20 MG tablet Take 20 mg by mouth daily.    [provider]    Family History Family History  Problem Relation Age of Onset  . Diabetes Father   . Heart disease Father   . Kidney disease Father     Social History Social History   Tobacco Use  .  Smoking status: Never Smoker  . Smokeless tobacco: Never Used  Substance Use Topics  . Alcohol use: Yes    Alcohol/week: 0.6 oz    Types: 1 Cans of beer per week    Comment: occasional  . Drug use: No     Allergies   Penicillins   Review of Systems Review of Systems  Constitutional: Negative for fever.  HENT: Positive for congestion, rhinorrhea and sneezing.   Eyes: Positive for redness and itching. Negative for photophobia, discharge and visual disturbance.  Respiratory: Negative for cough and shortness of breath.   Gastrointestinal: Negative for nausea and vomiting.  Genitourinary: Negative for decreased urine volume.  Musculoskeletal: Negative for back pain.  Skin:       Redness and dryness around eyes  Neurological: Negative for  headaches.     Physical Exam Updated Vital Signs BP (!) 150/96 (BP Location: Right Arm)   Pulse 81   Temp 98.1 F (36.7 C) (Oral)   Resp 18   Ht 6\' 2"  (1.88 m)   Wt (!) 142 kg (313 lb)   SpO2 99%   BMI 40.19 kg/m   Physical Exam  Constitutional: He is oriented to person, place, and time. He appears well-developed and well-nourished. No distress.  HENT:  Head: Normocephalic and atraumatic.  Nose: Nose normal.  Mouth/Throat: Oropharynx is clear and moist.  Eyes: Pupils are equal, round, and reactive to light. EOM are normal.  bilat conjunctiva are injected, but only to exposed part of eye. No pain with EOM. Tonometry L25, R23. Fluoroscein stain completed with no uptake bilat. Skin around bilat eyes is erythematous and dry. No periorbital swelling.  Neck: Normal range of motion. Neck supple.  No nuchal rigidity  Cardiovascular: Normal rate and regular rhythm.  Pulmonary/Chest: Effort normal and breath sounds normal.  Musculoskeletal: Normal range of motion.  Neurological: He is alert and oriented to person, place, and time. No cranial nerve deficit.  Skin: Skin is warm and dry.     ED Treatments / Results  Labs (all labs ordered are listed, but only abnormal results are displayed) Labs Reviewed - No data to display  EKG None  Radiology No results found.  Procedures Procedures (including critical care time)  Medications Ordered in ED Medications  tetracaine (PONTOCAINE) 0.5 % ophthalmic solution 1 drop (1 drop Both Eyes Given 07/03/17 1020)  fluorescein ophthalmic strip 1 strip (1 strip Both Eyes Given by Other 07/03/17 1020)     Initial Impression / Assessment and Plan / ED Course  I have reviewed the triage vital signs and the nursing notes.  Pertinent labs & imaging results that were available during my care of the patient were reviewed by me and considered in my medical decision making (see chart for details).     On review of records patient's last visual  acuity during last visit was 20/25 -2 on right and 20/25 -2 on left. Last tonometry reading was 20 on right and 17 in left.   Discussed pt presentation and exam findings with Dr. Rosalia Hammers, who agrees with the plan to give rx for ophthalmologist drops to tx allergic conjunctivitis as well as to have pt continue latanoprost and have him f/u with ophtho in 24-48 hours..    Final Clinical Impressions(s) / ED Diagnoses   Final diagnoses:  Allergic conjunctivitis of both eyes    Pt dx likely allergic conjunctivitis based on presentation & eye exam, no antibiotics are indicated.  No evidence of HSV or VSV infection. Pt  is not a contact lens wearer.  Exam non-concerning for orbital cellulitis, hyphema, corneal ulcers, corneal abrasions or trauma. Pt intraocular pressure was increased today likely secondary to him not taking his latanoprost. He has known h/o intraocular HTN. Do not suspect that sxs are due to increased pressure and do not suspect acute angle closure glaucoma. Visual acuity WNL. No pain with EOM. PERRL. Conjunctiva is erythematous only in exposed areas on eye.   Pt given Rx for ketotifen eye drops as well as instructed to start taking claritin daily for allergy sxs. Patient has been instructed to use cool compresses and practice personal hygiene with frequent hand washing. Pt given strict instructions to f/u with his ophthalmologist within 24-48 hours for re-eval and to return to ED if any new or worsening sxs in the meantime.  ED Discharge Orders        Ordered    ketotifen (ZADITOR) 0.025 % ophthalmic solution  Every 12 hours     07/03/17 1025       Marlyn Rabine S, PA-C 07/03/17 1046    Margarita Grizzle, MD 07/03/17 1659

## 2017-07-03 NOTE — Discharge Instructions (Signed)
You are given a prescription for eyedrops to help your symptoms.  You should use 1 drop in both eyes every 12 hours.  If you experience any adverse symptoms from these eyedrops he should stop taking them.  You need to continue taking your latanoprost eyedrops at home.  You need to contact your ophthalmologist and follow-up with them for reevaluation in the next 24-48 hours.  You will need to return to the emergency department for any pain in your eyes, blurred vision, pain when you move your eyes, or any new or worsening symptoms.

## 2018-05-13 ENCOUNTER — Emergency Department (HOSPITAL_COMMUNITY)
Admission: EM | Admit: 2018-05-13 | Discharge: 2018-05-13 | Disposition: A | Discharge status: Home / Self Care | Payer: BC Managed Care – PPO | Attending: Emergency Medicine | Admitting: Emergency Medicine

## 2018-05-13 DIAGNOSIS — Z79899 Other long term (current) drug therapy: Secondary | ICD-10-CM | POA: Insufficient documentation

## 2018-05-13 DIAGNOSIS — T783XXA Angioneurotic edema, initial encounter: Secondary | ICD-10-CM | POA: Insufficient documentation

## 2018-05-13 DIAGNOSIS — J45909 Unspecified asthma, uncomplicated: Secondary | ICD-10-CM | POA: Diagnosis not present

## 2018-05-13 DIAGNOSIS — Z7982 Long term (current) use of aspirin: Secondary | ICD-10-CM | POA: Diagnosis not present

## 2018-05-13 DIAGNOSIS — Z794 Long term (current) use of insulin: Secondary | ICD-10-CM | POA: Diagnosis not present

## 2018-05-13 DIAGNOSIS — I10 Essential (primary) hypertension: Secondary | ICD-10-CM | POA: Insufficient documentation

## 2018-05-13 DIAGNOSIS — R600 Localized edema: Secondary | ICD-10-CM | POA: Diagnosis present

## 2018-05-13 DIAGNOSIS — E119 Type 2 diabetes mellitus without complications: Secondary | ICD-10-CM | POA: Insufficient documentation

## 2018-05-13 LAB — BASIC METABOLIC PANEL
Anion gap: 11 (ref 5–15)
BUN: 19 mg/dL (ref 6–20)
CO2: 20 mmol/L — AB (ref 22–32)
Calcium: 8.8 mg/dL — ABNORMAL LOW (ref 8.9–10.3)
Chloride: 104 mmol/L (ref 98–111)
Creatinine, Ser: 1.83 mg/dL — ABNORMAL HIGH (ref 0.61–1.24)
GFR calc Af Amer: 50 mL/min — ABNORMAL LOW (ref >60)
GFR, EST NON AFRICAN AMERICAN: 43 mL/min — AB (ref >60)
Glucose, Bld: 96 mg/dL (ref 70–99)
Potassium: 4.4 mmol/L (ref 3.5–5.1)
Sodium: 135 mmol/L (ref 135–145)

## 2018-05-13 LAB — CBC WITH DIFFERENTIAL/PLATELET
Abs Immature Granulocytes: 0.03 10*3/uL (ref 0.00–0.07)
Basophils Absolute: 0 10*3/uL (ref 0.0–0.1)
Basophils Relative: 0 %
Eosinophils Absolute: 0.5 10*3/uL (ref 0.0–0.5)
Eosinophils Relative: 9 %
HEMATOCRIT: 33.5 % — AB (ref 39.0–52.0)
HEMOGLOBIN: 10.1 g/dL — AB (ref 13.0–17.0)
Immature Granulocytes: 1 %
Lymphocytes Relative: 26 %
Lymphs Abs: 1.5 10*3/uL (ref 0.7–4.0)
MCH: 27.2 pg (ref 26.0–34.0)
MCHC: 30.1 g/dL (ref 30.0–36.0)
MCV: 90.1 fL (ref 80.0–100.0)
MONOS PCT: 7 %
Monocytes Absolute: 0.4 10*3/uL (ref 0.1–1.0)
Neutro Abs: 3.3 10*3/uL (ref 1.7–7.7)
Neutrophils Relative %: 57 %
Platelets: 347 10*3/uL (ref 150–400)
RBC: 3.72 MIL/uL — ABNORMAL LOW (ref 4.22–5.81)
RDW: 15 % (ref 11.5–15.5)
WBC: 5.8 10*3/uL (ref 4.0–10.5)
nRBC: 0 % (ref 0.0–0.2)

## 2018-05-13 MED ORDER — DIPHENHYDRAMINE HCL 25 MG PO TABS: 25.0000 mg | ORAL_TABLET | Freq: Three times a day (TID) | ORAL | 0 refills | Status: AC

## 2018-05-13 MED ORDER — ONDANSETRON HCL 4 MG/2ML IJ SOLN
4.0000 mg | Freq: Once | INTRAMUSCULAR | Status: AC
  Administered 2018-05-13: 4 mg via INTRAVENOUS
  Filled 2018-05-13: qty 2

## 2018-05-13 MED ORDER — SODIUM CHLORIDE 0.9 % IV BOLUS
1000.0000 mL | Freq: Once | INTRAVENOUS | Status: AC
Start: 2018-05-13 — End: 2018-05-13
  Administered 2018-05-13: 1000 mL via INTRAVENOUS

## 2018-05-13 MED ORDER — EPINEPHRINE 0.3 MG/0.3ML IJ SOAJ: 0.3000 mg | INTRAMUSCULAR | 1 refills | Status: AC | PRN

## 2018-05-13 MED ORDER — SODIUM CHLORIDE 0.9 % IV SOLN
INTRAVENOUS | Status: DC
  Administered 2018-05-13: 125 mL/h via INTRAVENOUS

## 2018-05-13 MED ORDER — PREDNISONE 20 MG PO TABS: 40.0000 mg | ORAL_TABLET | Freq: Every day | ORAL | 0 refills | Status: AC

## 2018-05-13 MED ORDER — EPINEPHRINE 0.3 MG/0.3ML IJ SOAJ
0.3000 mg | Freq: Once | INTRAMUSCULAR | Status: AC
Start: 2018-05-13 — End: 2018-05-13
  Administered 2018-05-13: 0.3 mg via INTRAMUSCULAR
  Filled 2018-05-13: qty 0.3

## 2018-05-13 MED ORDER — METHYLPREDNISOLONE SODIUM SUCC 125 MG IJ SOLR
125.0000 mg | Freq: Once | INTRAMUSCULAR | Status: AC
  Administered 2018-05-13: 125 mg via INTRAVENOUS
  Filled 2018-05-13: qty 2

## 2018-05-13 MED ORDER — AMLODIPINE BESYLATE 5 MG PO TABS: 5.0000 mg | ORAL_TABLET | Freq: Every day | ORAL | 0 refills | Status: AC

## 2018-05-13 MED ORDER — FAMOTIDINE IN NACL 20-0.9 MG/50ML-% IV SOLN
20.0000 mg | Freq: Once | INTRAVENOUS | Status: AC
  Administered 2018-05-13: 20 mg via INTRAVENOUS
  Filled 2018-05-13: qty 50

## 2018-05-13 MED ORDER — DIPHENHYDRAMINE HCL 50 MG/ML IJ SOLN
25.0000 mg | Freq: Once | INTRAMUSCULAR | Status: AC
Start: 2018-05-13 — End: 2018-05-13
  Administered 2018-05-13: 25 mg via INTRAVENOUS
  Filled 2018-05-13: qty 1

## 2018-05-13 NOTE — ED Provider Notes (Signed)
[facility] EMERGENCY DEPARTMENT Provider Note   CSN: [id number] Arrival date & time: 05/13/18  6433   History   Chief Complaint Chief Complaint  Patient presents with  . Angioedema    HPI Dylan Patton is a 48 y.o. male with past medical history significant for hypertension, lisinopril, diabetes, depression, asthma, sleep apnea who presents for evaluation of facial swelling.  She states he woke this morning at approximately 5 AM and noticed swelling to his lips, tongue and eyes.  Patient states he did recently restart his lisinopril after being off of it for hip replacement approximately 1 month ago.  Patient denies previous history of angioedema or anaphylactic reactions.  Patient states he does have a history of allergic reactions with itching and hives.  Patient states this was to penicillin medications.  Denies previous history of having to use an EpiPen.  Denies fever, chills, nausea, vomiting, headache, neck pain, neck stiffness, chest pain, shortness of breath, abdominal pain, diarrhea, dysuria, urticaria, erythema, pruritus. States he is able to tolerate his oral secretions and is able to breath but feelings a "fullness in my mouth."  Has not taken anything for symptoms PTA.  History obtained from patient. No interpretor was used.  HPI  Past Medical History:  Diagnosis Date  . Anxiety   . Asthma   . Cardiac arrhythmia   . Depression   . Diabetes mellitus   . Hypertension   . Sleep apnea     There are no active problems to display for this patient.   No past surgical history on file.      Home Medications    Prior to Admission medications   Medication Sig Start Date End Date Taking? Authorizing Provider  aspirin 81 MG chewable tablet Chew 81 mg by mouth 2 (two) times daily.    Yes [provider]  empagliflozin (JARDIANCE) 25 MG TABS tablet Take 25 mg by mouth daily.   Yes [provider]  glipiZIDE (GLUCOTROL) 10 MG tablet  Take 10 mg by mouth 2 (two) times daily before a meal.   Yes [provider]  Insulin Glargine (BASAGLAR KWIKPEN [place]) Inject 45 Units into the skin daily.   Yes [provider]  latanoprost (XALATAN) 0.005 % ophthalmic solution Place 1 drop into both eyes at bedtime.   Yes [provider]  lisinopril (PRINIVIL,ZESTRIL) 40 MG tablet Take 40 mg by mouth daily.   Yes [provider]  metFORMIN (GLUCOPHAGE) 500 MG tablet Take 500 mg by mouth 2 (two) times daily with a meal.   Yes [provider]  ondansetron (ZOFRAN ODT) 4 MG disintegrating tablet Take 1 tablet (4 mg total) by mouth every 8 (eight) hours as needed for nausea. 04/17/13  Yes Garlon Hatchet, PA-C  simvastatin (ZOCOR) 20 MG tablet Take 20 mg by mouth daily.   Yes [provider]  sitaGLIPtin (JANUVIA) 100 MG tablet Take 100 mg by mouth daily.   Yes [provider]  amLODipine (NORVASC) 5 MG tablet Take 1 tablet (5 mg total) by mouth daily. 05/13/18   Zuri Bradway A, PA-C  diphenhydrAMINE (BENADRYL) 25 MG tablet Take 1 tablet (25 mg total) by mouth every 8 (eight) hours for 5 days. 05/13/18 05/18/18  Elisha Mcgruder A, PA-C  EPINEPHrine 0.3 mg/0.3 mL IJ SOAJ injection Inject 0.3 mLs (0.3 mg total) into the muscle as needed for anaphylaxis. 05/13/18   Sparkles Mcneely A, PA-C  pantoprazole (PROTONIX) 20 MG tablet Take 1 tablet (  20 mg total) by mouth daily. Patient not taking: Reported on 05/13/2018 04/17/13   Garlon HatchetSanders, Lisa M, PA-C  predniSONE (DELTASONE) 20 MG tablet Take 2 tablets (40 mg total) by mouth daily for 5 days. 05/13/18 05/18/18  Katyra Tomassetti A, PA-C    Family History Family History  Problem Relation Age of Onset  . Diabetes Father   . Heart disease Father   . Kidney disease Father     Social History Social History   Tobacco Use  . Smoking status: Never Smoker  . Smokeless tobacco: Never Used  Substance Use Topics  . Alcohol use: Yes    Alcohol/week: 1.0 standard  drinks    Types: 1 Cans of beer per week    Comment: occasional  . Drug use: No     Allergies   Penicillins   Review of Systems Review of Systems  Constitutional: Negative.   HENT: Positive for facial swelling and trouble swallowing. Negative for congestion, dental problem, drooling, ear discharge, ear pain, hearing loss, mouth sores, nosebleeds, postnasal drip, rhinorrhea, sinus pressure, sinus pain, sneezing, sore throat and voice change.   Eyes: Negative.   Respiratory: Negative.   Cardiovascular: Negative.   Gastrointestinal: Negative.   Genitourinary: Negative.   Musculoskeletal: Negative.   Skin: Negative.   Neurological: Negative.   All other systems reviewed and are negative.    Physical Exam Updated Vital Signs BP 128/67 (BP Location: Right Arm)   Pulse 88   Temp 98.1 F (36.7 C) (Oral)   Resp 16   SpO2 100%   Physical Exam Vitals signs and nursing note reviewed.  Constitutional:      General: He is not in acute distress.    Appearance: He is well-developed. He is not ill-appearing, toxic-appearing or diaphoretic.     Comments: Sitting in bed talk with nursing on initial evaluation. Does not appear in acute distress.  HENT:     Head: Normocephalic and atraumatic. No right periorbital erythema or left periorbital erythema.     Jaw: There is normal jaw occlusion.     Nose: Nose normal.     Right Sinus: No maxillary sinus tenderness or frontal sinus tenderness.     Left Sinus: No maxillary sinus tenderness or frontal sinus tenderness.     Mouth/Throat:     Lips: Pink. No lesions.     Mouth: Mucous membranes are moist. No lacerations or oral lesions.     Pharynx: Oropharynx is clear. Uvula midline. No oropharyngeal exudate or uvula swelling.     Comments: Able to visualize posterior oropharynx. Mild erythema to posterior pharynx without exudate. Mild tongue edema. Uvula midline without deviation and without swelling Eyes:     Pupils: Pupils are equal, round,  and reactive to light.     Comments: Mild edema to bilateral skin surrounding eyes.  Neck:     Musculoskeletal: Normal range of motion and neck supple.     Trachea: Trachea and phonation normal.     Comments: No neck stiffness or neck rigidity. Phonation normal. No trismus or drooling. Cardiovascular:     Rate and Rhythm: Normal rate and regular rhythm.     Pulses: Normal pulses.     Heart sounds: Normal heart sounds.  Pulmonary:     Effort: Pulmonary effort is normal. No respiratory distress.     Comments: Clear to bilateral without wheeze, rhonchi or rales.  No stridor.  No accessory muscle usage.  Able speak in full sentences without difficulty. Abdominal:  General: There is no distension.     Palpations: Abdomen is soft.     Comments: Soft, Nontender without rebound or guarding.  Normoactive bowel sounds.  Musculoskeletal: Normal range of motion.     Comments: Moves all extremities without difficulty.  Ambulatory in department thought difficulty.  Feet:     Comments: 2+ DP, PT pulses.  Lymphadenopathy:     Cervical: No cervical adenopathy.  Skin:    General: Skin is warm and dry.     Comments: No rashes, urticaria, lesions, erythema, warmth  Neurological:     Mental Status: He is alert.      ED Treatments / Results  Labs (all labs ordered are listed, but only abnormal results are displayed) Labs Reviewed  CBC WITH DIFFERENTIAL/PLATELET - Abnormal; Notable for the following components:      Result Value   RBC 3.72 (*)    Hemoglobin 10.1 (*)    HCT 33.5 (*)    All other components within normal limits  BASIC METABOLIC PANEL - Abnormal; Notable for the following components:   CO2 20 (*)    Creatinine, Ser 1.83 (*)    Calcium 8.8 (*)    GFR calc non Af Amer 43 (*)    GFR calc Af Amer 50 (*)    All other components within normal limits    EKG None  Radiology No results found.  Procedures Procedures (including critical care time)  Medications Ordered in  ED Medications  sodium chloride 0.9 % bolus 1,000 mL (0 mLs Intravenous Stopped 05/13/18 0806)    And  0.9 %  sodium chloride infusion (125 mL/hr Intravenous New Bag/Given 05/13/18 0809)  diphenhydrAMINE (BENADRYL) injection 25 mg (25 mg Intravenous Given 05/13/18 0624)  EPINEPHrine (EPI-PEN) injection 0.3 mg (0.3 mg Intramuscular Given 05/13/18 0623)  ondansetron (ZOFRAN) injection 4 mg (4 mg Intravenous Given 05/13/18 0624)  methylPREDNISolone sodium succinate (SOLU-MEDROL) 125 mg/2 mL injection 125 mg (125 mg Intravenous Given 05/13/18 0625)  famotidine (PEPCID) IVPB 20 mg premix (0 mg Intravenous Stopped 05/13/18 0655)     Initial Impression / Assessment and Plan / ED Course  I have reviewed the triage vital signs and the nursing notes.  Pertinent labs & imaging results that were available during my care of the patient were reviewed by me and considered in my medical decision making (see chart for details).  48 year old male appears otherwise well presents for evaluation of facial swelling.  Afebrile, nonseptic, non-ill-appearing.  Lungs clear to auscultation bilateral without wheeze, rhonchi or rales.  No stridor.  No accessory muscle usage.  Able speak in full sentences without difficulty.  Edema to lips, bilateral eyes and generalized face.  Able to visualize posterior oropharynx without difficulty.  There is some mild posterior erythema.  Patient did recently resume lisinopril after being off of this after hip replacement which occurred 1 month ago.  No history of prior anaphylactic reactions or angioedema.  No skin manifestations of allergic reaction.  Concern for angioedema vs acute allergic reaction.  Will give Epipen, Zofran, steroids, benadryl, fluids and pepcid and reevaluate.   0700: Patient with improvement in facial swelling however still has significant lip swelling. Able to visualize posterior oropharynx. Able to speak in full sentences without difficulty.  CBC without leukocytosis,  hemoglobin 10.1, creatinine 1.83 previous 1.53 11/19, GFR 50 previous 72 11/19. Will continue to reevaluate.  [id number]: Face with significant improvement in facial swelling.  Denies sensation of throat or neck tightness.  Lungs clear to auscultation bilaterally  without wheeze, rhonchi or rales. No stridor.  Able speak in full sentences.  Oxygen saturation 100% on room air with good waveform.  Will continue to reevaluate.  1610: Patient with continued improvement in facial swelling.  No tongue swelling at this time.  He does have mild lip swelling, however this has significantly improved from initial presentation. Denies sensation of throat or neck tingling or sensation of throat closing.  Able to speak in full sentences.  Discussed outpatient vs inpatient for observation of facial swelling.  Patient states he would like to trial of outpatient management.  Thoroughly discussed strict return precautions.  Patient will be DC home with EpiPen, steroids, Benadryl as well as Pepcid.  Will DC patient's lisinopril and start him on amlodipine for his blood pressure.  Discussed follow-up with PCP for reevaluation over the next 2 days for recheck of blood pressure as well as reevaluation of patient's facial swelling.  Will trial p.o. intake and reevaluate.  1000: Patient able to tolerate PO intake without difficulty. Moderate improvement in facial swelling throughout course of ED stay. Patient has been observed for >4 hours.  Patient is hemodynamically stable and appropriate for DC home at this time.  I have again, discussed strict return precautions.  Patient voiced understanding and is agreeable for follow-up.  Patient re-evaluated prior to dc, is hemodynamically stable, in no respiratory distress, and denies the feeling of throat closing. Pt is to follow up with their PCP. Pt is agreeable with plan & verbalizes understanding.   Final Clinical Impressions(s) / ED Diagnoses   Final diagnoses:  Angioedema, initial  encounter    ED Discharge Orders         Ordered    diphenhydrAMINE (BENADRYL) 25 MG tablet  Every 8 hours     05/13/18 0950    predniSONE (DELTASONE) 20 MG tablet  Daily     05/13/18 0950    EPINEPHrine 0.3 mg/0.3 mL IJ SOAJ injection  As needed     05/13/18 0951    amLODipine (NORVASC) 5 MG tablet  Daily     05/13/18 0952           Saba Gomm A, PA-C 05/13/18 1027    Gilda Crease, MD 05/24/18 [phone]

## 2018-05-13 NOTE — ED Notes (Signed)
DC instructions discussed and p[t states understanding. Home stable with steady gait with cane. WC refused.

## 2018-05-13 NOTE — ED Notes (Signed)
Patient has been moved to hallway 17 patient has been placed on a portable monitor

## 2018-05-13 NOTE — ED Notes (Signed)
Pt speaks easily and lips appear less swollen. Lines in lips much more noticeable. Pt agrees that swelling has decreased.

## 2018-05-13 NOTE — ED Notes (Addendum)
Pt out of bed to bathroom with minimal assist. Decreased swelling noted at top and bottom lip. Pt agrees swelling very decreased. Cautioned that swelling can return and to mionitor for same.

## 2018-05-13 NOTE — ED Triage Notes (Signed)
Pt reports waking up this AM with swelling to lips/face. Pt takes lisinopril. Reports some trouble swallowing. Pt in NAD.

## 2018-05-13 NOTE — Discharge Instructions (Signed)
Evaluated today for angioedema from your medications. Please stop taking the Lisinopril. I have started you on Amlodipine. Please follow up with PCP for reevaluation in the next 2 days. I have also given you prednisone, benadryl. Please take as prescribed. Please monitor you blood sugars frequently. Please stop taking the Prednisone if you blood sugar reaches over 300.  I have also given you an epi-pen. If you develop severe facial swelling or sensation of throat closing again. Please use the, into the thigh and call 911.  Return to the ED with any new or worsening symptoms

## 2019-06-12 ENCOUNTER — Ambulatory Visit: Payer: BC Managed Care – PPO | Attending: Family

## 2019-06-12 DIAGNOSIS — Z23 Encounter for immunization: Secondary | ICD-10-CM | POA: Insufficient documentation

## 2019-06-12 NOTE — Progress Notes (Signed)
   Covid-19 Vaccination Clinic  Name:  Dylan Patton    MRN: [id number] DOB: [birth year]  06/12/2019  Mr. [name] was observed post Covid-19 immunization for 15 minutes without incident. He was provided with Vaccine Information Sheet and instruction to access the V-Safe system.   Mr. [name] was instructed to call 911 with any severe reactions post vaccine: Marland Kitchen Difficulty breathing  . Swelling of face and throat  . A fast heartbeat  . A bad rash all over body  . Dizziness and weakness   Immunizations Administered    Name Date Dose VIS Date Route   Moderna COVID-19 Vaccine 06/12/2019  3:59 PM 0.5 mL 03/11/2019 Intramuscular   Manufacturer: Moderna   Lot: [id number]   NDC: 11216-244-69

## 2019-07-15 ENCOUNTER — Ambulatory Visit: Payer: BC Managed Care – PPO | Attending: Family

## 2019-07-15 DIAGNOSIS — Z23 Encounter for immunization: Secondary | ICD-10-CM

## 2019-07-15 NOTE — Progress Notes (Signed)
   Covid-19 Vaccination Clinic  Name:  Dylan Patton    MRN: [id number] DOB: 10/03/1970  07/15/2019  Mr. Dylan Patton was observed post Covid-19 immunization for 15 minutes without incident. He was provided with Vaccine Information Sheet and instruction to access the V-Safe system.   Mr. Dylan Patton was instructed to call 911 with any severe reactions post vaccine: Marland Kitchen Difficulty breathing  . Swelling of face and throat  . A fast heartbeat  . A bad rash all over body  . Dizziness and weakness   Immunizations Administered    Name Date Dose VIS Date Route   Moderna COVID-19 Vaccine 07/15/2019  4:29 PM 0.5 mL 03/11/2019 Intramuscular   Manufacturer: Moderna   Lot: [id number]   NDC: 14643-142-76

## 2019-11-28 ENCOUNTER — Other Ambulatory Visit: Payer: Self-pay

## 2019-11-28 ENCOUNTER — Other Ambulatory Visit: Payer: BC Managed Care – PPO

## 2019-11-28 DIAGNOSIS — Z20822 Contact with and (suspected) exposure to covid-19: Secondary | ICD-10-CM

## 2019-11-29 LAB — SARS-COV-2, NAA 2 DAY TAT

## 2019-11-29 LAB — NOVEL CORONAVIRUS, NAA: SARS-CoV-2, NAA: NOT DETECTED

## 2020-03-25 ENCOUNTER — Other Ambulatory Visit: Payer: Self-pay

## 2020-03-25 ENCOUNTER — Ambulatory Visit (HOSPITAL_COMMUNITY)
Admission: EM | Admit: 2020-03-25 | Discharge: 2020-03-25 | Disposition: A | Discharge status: Home / Self Care | Payer: BC Managed Care – PPO | Attending: Internal Medicine | Admitting: Internal Medicine

## 2020-03-25 ENCOUNTER — Encounter (HOSPITAL_COMMUNITY): Payer: Self-pay

## 2020-03-25 DIAGNOSIS — M79641 Pain in right hand: Secondary | ICD-10-CM

## 2020-03-25 MED ORDER — CLINDAMYCIN HCL 300 MG PO CAPS: 300.0000 mg | ORAL_CAPSULE | Freq: Three times a day (TID) | ORAL | 0 refills | Status: AC

## 2020-03-25 NOTE — ED Triage Notes (Signed)
Pt presents with right hand pain & significant swelling with no known injury since Wednesday.

## 2020-03-25 NOTE — Discharge Instructions (Signed)
It appears you have an infection known as cellulitis.  I have called in an antibiotic for you to start taking today.  Please follow-up with your primary care provider early next week.  In the meantime, if you should experience any fever, chills, worsening pain or swelling you will need to return or go to the emergency department

## 2020-03-25 NOTE — ED Provider Notes (Signed)
[facility]    CSN: [id number] Arrival date & time: 03/25/20  1000      History   Chief Complaint Chief Complaint  Patient presents with   Hand Pain    HPI Dylan Patton is a 49 y.o. male c hx of DM2, htn presents with complaints of right hand pain. Pt noticed pain when he woke up yesterday. No known injury or trauma, no recent fever or chills. Denies weakness, numbness or tingling, but increase in pressure with flexion of 4th and 5th finger.   Past Medical History:  Diagnosis Date   Anxiety    Asthma    Cardiac arrhythmia    Depression    Diabetes mellitus    Hypertension    Sleep apnea     There are no problems to display for this patient.   History reviewed. No pertinent surgical history.     Home Medications    Prior to Admission medications   Medication Sig Start Date End Date Taking? Authorizing Provider  amLODipine (NORVASC) 5 MG tablet Take 1 tablet (5 mg total) by mouth daily. 05/13/18   Henderly, Britni A, PA-C  aspirin 81 MG chewable tablet Chew 81 mg by mouth 2 (two) times daily.     [provider]  clindamycin (CLEOCIN) 300 MG capsule Take 1 capsule (300 mg total) by mouth 3 (three) times daily for 5 days. 03/25/20 03/30/20  Rolla Etienne, NP  diphenhydrAMINE (BENADRYL) 25 MG tablet Take 1 tablet (25 mg total) by mouth every 8 (eight) hours for 5 days. 05/13/18 05/18/18  Henderly, Britni A, PA-C  empagliflozin (JARDIANCE) 25 MG TABS tablet Take 25 mg by mouth daily.    [provider]  EPINEPHrine 0.3 mg/0.3 mL IJ SOAJ injection Inject 0.3 mLs (0.3 mg total) into the muscle as needed for anaphylaxis. 05/13/18   Henderly, Britni A, PA-C  glipiZIDE (GLUCOTROL) 10 MG tablet Take 10 mg by mouth 2 (two) times daily before a meal.    [provider]  Insulin Glargine (BASAGLAR KWIKPEN [place]) Inject 45 Units into the skin daily.    [provider]  latanoprost (XALATAN) 0.005 % ophthalmic solution Place 1 drop  into both eyes at bedtime.    [provider]  lisinopril (PRINIVIL,ZESTRIL) 40 MG tablet Take 40 mg by mouth daily.    [provider]  metFORMIN (GLUCOPHAGE) 500 MG tablet Take 500 mg by mouth 2 (two) times daily with a meal.    [provider]  ondansetron (ZOFRAN ODT) 4 MG disintegrating tablet Take 1 tablet (4 mg total) by mouth every 8 (eight) hours as needed for nausea. 04/17/13   Garlon Hatchet, PA-C  pantoprazole (PROTONIX) 20 MG tablet Take 1 tablet (20 mg total) by mouth daily. Patient not taking: Reported on 05/13/2018 04/17/13   Garlon Hatchet, PA-C  simvastatin (ZOCOR) 20 MG tablet Take 20 mg by mouth daily.    [provider]  sitaGLIPtin (JANUVIA) 100 MG tablet Take 100 mg by mouth daily.    [provider]    Family History Family History  Problem Relation Age of Onset   Diabetes Father    Heart disease Father    Kidney disease Father     Social History Social History   Tobacco Use   Smoking status: Never Smoker   Smokeless tobacco: Never Used  Substance Use Topics   Alcohol use: Yes    Alcohol/week: 1.0 standard drink    Types: 1 Cans  of beer per week    Comment: occasional   Drug use: No     Allergies   Penicillins   Review of Systems As stated in hpi, otherwise negative   Physical Exam Triage Vital Signs ED Triage Vitals  Enc Vitals Group     BP 03/25/20 1228 140/90     Pulse Rate 03/25/20 1228 88     Resp 03/25/20 1228 18     Temp 03/25/20 1228 97.7 F (36.5 C)     Temp Source 03/25/20 1228 Oral     SpO2 03/25/20 1228 99 %     Weight --      Height --      Head Circumference --      Peak Flow --      Pain Score 03/25/20 1227 8     Pain Loc --      Pain Edu? --      Excl. in GC? --    No data found.  Updated Vital Signs BP 140/90 (BP Location: Right Arm)    Pulse 88    Temp 97.7 F (36.5 C) (Oral)    Resp 18    SpO2 99%      Physical Exam Constitutional:      General: He is not in  acute distress.    Appearance: Normal appearance. He is not ill-appearing.  Musculoskeletal:     Comments: Pain with flexion or extension, but still with good ROM, chronic deformity of right ring finger from remote football injury. No other obvious deformity.   Skin:    General: Skin is warm and dry.     Findings: Erythema present.     Comments: Small ~1-2 circular lesion on dorsum of hand. Erythema and swelling most notable on dorsum of hand and at base of 4th and 5th fingers. No bruising, no drainage, no streaking   Neurological:     General: No focal deficit present.     Mental Status: He is alert.     Sensory: No sensory deficit.     Motor: No weakness.  Psychiatric:        Mood and Affect: Mood normal.        Behavior: Behavior normal.     UC Treatments / Results  Labs (all labs ordered are listed, but only abnormal results are displayed) Labs Reviewed - No data to display  EKG   Radiology No results found.  Procedures Procedures (including critical care time)  Medications Ordered in UC Medications - No data to display  Initial Impression / Assessment and Plan / UC Course  I have reviewed the triage vital signs and the nursing notes.  Pertinent labs & imaging results that were available during my care of the patient were reviewed by me and considered in my medical decision making (see chart for details).   Right hand pain -exam most c/w cellulitis with small lesion noted on dorsum of hand suggestive of insect bite -tx empirically with Clindamycin (pcn allergy) -follow-up PCP early next week -return for worsening pain, swelling or any fever/chills  Reviewed expections re: course of current medical issues. Questions answered. Outlined signs and symptoms indicating need for more acute intervention. Pt verbalized understanding. AVS given  Final Clinical Impressions(s) / UC Diagnoses   Final diagnoses:  Hand pain, right     Discharge Instructions     It  appears you have an infection known as cellulitis.  I have called in an antibiotic for you to start taking  today.  Please follow-up with your primary care provider early next week.  In the meantime, if you should experience any fever, chills, worsening pain or swelling you will need to return or go to the emergency department    ED Prescriptions    Medication Sig Dispense Auth. Provider   clindamycin (CLEOCIN) 300 MG capsule Take 1 capsule (300 mg total) by mouth 3 (three) times daily for 5 days. 15 capsule Rolla Etienne, NP     PDMP not reviewed this encounter.   Rolla Etienne, NP 03/25/20 2131

## 2020-08-27 ENCOUNTER — Other Ambulatory Visit: Payer: Self-pay

## 2020-08-27 DIAGNOSIS — Z794 Long term (current) use of insulin: Secondary | ICD-10-CM | POA: Insufficient documentation

## 2020-08-27 DIAGNOSIS — M25571 Pain in right ankle and joints of right foot: Secondary | ICD-10-CM | POA: Diagnosis not present

## 2020-08-27 DIAGNOSIS — Z7982 Long term (current) use of aspirin: Secondary | ICD-10-CM | POA: Insufficient documentation

## 2020-08-27 DIAGNOSIS — E119 Type 2 diabetes mellitus without complications: Secondary | ICD-10-CM | POA: Diagnosis not present

## 2020-08-27 DIAGNOSIS — Z79899 Other long term (current) drug therapy: Secondary | ICD-10-CM | POA: Diagnosis not present

## 2020-08-27 DIAGNOSIS — M79671 Pain in right foot: Secondary | ICD-10-CM | POA: Insufficient documentation

## 2020-08-27 DIAGNOSIS — Z7984 Long term (current) use of oral hypoglycemic drugs: Secondary | ICD-10-CM | POA: Insufficient documentation

## 2020-08-27 DIAGNOSIS — M79604 Pain in right leg: Secondary | ICD-10-CM | POA: Diagnosis not present

## 2020-08-27 DIAGNOSIS — I10 Essential (primary) hypertension: Secondary | ICD-10-CM | POA: Insufficient documentation

## 2020-08-27 DIAGNOSIS — J45909 Unspecified asthma, uncomplicated: Secondary | ICD-10-CM | POA: Insufficient documentation

## 2020-08-28 ENCOUNTER — Emergency Department (HOSPITAL_COMMUNITY)
Admission: EM | Admit: 2020-08-28 | Discharge: 2020-08-28 | Disposition: A | Discharge status: Home / Self Care | Payer: BC Managed Care – PPO | Attending: Emergency Medicine | Admitting: Emergency Medicine

## 2020-08-28 ENCOUNTER — Emergency Department (HOSPITAL_COMMUNITY): Payer: BC Managed Care – PPO

## 2020-08-28 ENCOUNTER — Encounter (HOSPITAL_COMMUNITY): Payer: Self-pay

## 2020-08-28 DIAGNOSIS — M79604 Pain in right leg: Secondary | ICD-10-CM

## 2020-08-28 MED ORDER — KETOROLAC TROMETHAMINE 15 MG/ML IJ SOLN
15.0000 mg | Freq: Once | INTRAMUSCULAR | Status: AC
  Administered 2020-08-28: 15 mg via INTRAMUSCULAR
  Filled 2020-08-28: qty 1

## 2020-08-28 MED ORDER — PREDNISONE 20 MG PO TABS: 40.0000 mg | ORAL_TABLET | Freq: Every day | ORAL | 0 refills | Status: DC

## 2020-08-28 MED ORDER — HYDROCODONE-ACETAMINOPHEN 5-325 MG PO TABS: 1.0000 | ORAL_TABLET | Freq: Four times a day (QID) | ORAL | 0 refills | Status: DC | PRN

## 2020-08-28 NOTE — ED Provider Notes (Signed)
[facility] EMERGENCY DEPARTMENT Provider Note   CSN: [id number] Arrival date & time: 08/27/20  2330     History Chief Complaint  Patient presents with  . Ankle Pain    Dylan Patton is a 50 y.o. male.  HPI Patient presents with foot and ankle pain.  He notes history of orthopedic injuries from playing football.  4 days ago the patient was playing basketball, he denies unremarkable injury, event.  However, the day following the game, he developed pain and swelling in his right foot and ankle.  Since that time symptoms have been persistent, with diffuse pain throughout the foot and ankle.  No relief with OTC medication.  He has begun using a cane, this has not changed his symptoms appreciably.  No other injury, fall, complaints.     Past Medical History:  Diagnosis Date  . Anxiety   . Asthma   . Cardiac arrhythmia   . Depression   . Diabetes mellitus   . Hypertension   . Sleep apnea     There are no problems to display for this patient.   History reviewed. No pertinent surgical history.     Family History  Problem Relation Age of Onset  . Diabetes Father   . Heart disease Father   . Kidney disease Father     Social History   Tobacco Use  . Smoking status: Never Smoker  . Smokeless tobacco: Never Used  Substance Use Topics  . Alcohol use: Yes    Alcohol/week: 1.0 standard drink    Types: 1 Cans of beer per week    Comment: occasional  . Drug use: No    Home Medications Prior to Admission medications   Medication Sig Start Date End Date Taking? Authorizing Provider  HYDROcodone-acetaminophen (NORCO/VICODIN) 5-325 MG tablet Take 1 tablet by mouth every 6 (six) hours as needed for severe pain. 08/28/20  Yes Gerhard Munch, MD  predniSONE (DELTASONE) 20 MG tablet Take 2 tablets (40 mg total) by mouth daily with breakfast. For the next four days 08/28/20  Yes Gerhard Munch, MD  amLODipine (NORVASC) 5 MG tablet Take 1 tablet (5 mg total)  by mouth daily. 05/13/18   Henderly, Britni A, PA-C  aspirin 81 MG chewable tablet Chew 81 mg by mouth 2 (two) times daily.     [provider]  diphenhydrAMINE (BENADRYL) 25 MG tablet Take 1 tablet (25 mg total) by mouth every 8 (eight) hours for 5 days. 05/13/18 05/18/18  Henderly, Britni A, PA-C  empagliflozin (JARDIANCE) 25 MG TABS tablet Take 25 mg by mouth daily.    [provider]  EPINEPHrine 0.3 mg/0.3 mL IJ SOAJ injection Inject 0.3 mLs (0.3 mg total) into the muscle as needed for anaphylaxis. 05/13/18   Henderly, Britni A, PA-C  glipiZIDE (GLUCOTROL) 10 MG tablet Take 10 mg by mouth 2 (two) times daily before a meal.    [provider]  Insulin Glargine (BASAGLAR KWIKPEN Niland) Inject 45 Units into the skin daily.    [provider]  latanoprost (XALATAN) 0.005 % ophthalmic solution Place 1 drop into both eyes at bedtime.    [provider]  lisinopril (PRINIVIL,ZESTRIL) 40 MG tablet Take 40 mg by mouth daily.    [provider]  metFORMIN (GLUCOPHAGE) 500 MG tablet Take 500 mg by mouth 2 (two) times daily with a meal.    [provider]  ondansetron (ZOFRAN ODT) 4 MG disintegrating tablet Take 1 tablet (4 mg total) by  mouth every 8 (eight) hours as needed for nausea. 04/17/13   Garlon Hatchet, PA-C  pantoprazole (PROTONIX) 20 MG tablet Take 1 tablet (20 mg total) by mouth daily. Patient not taking: Reported on 05/13/2018 04/17/13   Garlon Hatchet, PA-C  simvastatin (ZOCOR) 20 MG tablet Take 20 mg by mouth daily.    [provider]  sitaGLIPtin (JANUVIA) 100 MG tablet Take 100 mg by mouth daily.    [provider]    Allergies    Penicillins  Review of Systems   Review of Systems  Constitutional:       Per HPI, otherwise negative  HENT:       Per HPI, otherwise negative  Respiratory:       Per HPI, otherwise negative  Cardiovascular:       Per HPI, otherwise negative  Gastrointestinal: Negative for vomiting.   Endocrine:       Negative aside from HPI  Genitourinary:       Neg aside from HPI   Musculoskeletal:       Per HPI, otherwise negative  Skin: Negative.   Neurological: Negative for syncope.    Physical Exam Updated Vital Signs BP (!) 146/90 (BP Location: Right Arm)   Pulse 91   Temp 98.5 F (36.9 C) (Oral)   Resp 15   Ht 6\' 2"  (1.88 m)   Wt (!) 142.9 kg   SpO2 97%   BMI 40.44 kg/m   Physical Exam Vitals and nursing note reviewed.  Constitutional:      General: He is not in acute distress.    Appearance: He is well-developed.     Comments: Large adult male awake and alert sitting upright speaking clearly.  HENT:     Head: Normocephalic and atraumatic.  Eyes:     Conjunctiva/sclera: Conjunctivae normal.  Cardiovascular:     Rate and Rhythm: Normal rate and regular rhythm.  Pulmonary:     Effort: Pulmonary effort is normal. No respiratory distress.     Breath sounds: No stridor.  Abdominal:     General: There is no distension.  Musculoskeletal:       Legs:  Skin:    General: Skin is warm and dry.  Neurological:     Mental Status: He is alert and oriented to person, place, and time.     ED Results / Procedures / Treatments   Labs (all labs ordered are listed, but only abnormal results are displayed) Labs Reviewed - No data to display  EKG None  Radiology DG Ankle Complete Right  Result Date: 08/28/2020 CLINICAL DATA:  Patient reports R ankle pain after playing basketball, denies injury that he remembers EXAM: RIGHT ANKLE - COMPLETE 3+ VIEW; RIGHT FOOT COMPLETE - 3+ VIEW COMPARISON:  None. FINDINGS: Right foot: There is no evidence of fracture, dislocation. Talonavicular as well as medial cuneiform degenerative changes. There is no evidence of arthropathy or other focal bone abnormality. Soft tissues are unremarkable. Vascular calcifications. Right ankle: No evidence of fracture, dislocation, or joint effusion. Old medial and lateral malleolar fractures. No  evidence of severe arthropathy. No aggressive appearing focal bone abnormality. Soft tissues are unremarkable. IMPRESSION: 1. No acute displaced fracture or dislocation of the right ankle and foot. 2. Right midfoot degenerative changes. Electronically Signed   By: 08/30/2020 M.D.   On: 08/28/2020 01:01   DG Foot Complete Right  Result Date: 08/28/2020 CLINICAL DATA:  Patient reports R ankle pain after playing basketball, denies injury that he  remembers EXAM: RIGHT ANKLE - COMPLETE 3+ VIEW; RIGHT FOOT COMPLETE - 3+ VIEW COMPARISON:  None. FINDINGS: Right foot: There is no evidence of fracture, dislocation. Talonavicular as well as medial cuneiform degenerative changes. There is no evidence of arthropathy or other focal bone abnormality. Soft tissues are unremarkable. Vascular calcifications. Right ankle: No evidence of fracture, dislocation, or joint effusion. Old medial and lateral malleolar fractures. No evidence of severe arthropathy. No aggressive appearing focal bone abnormality. Soft tissues are unremarkable. IMPRESSION: 1. No acute displaced fracture or dislocation of the right ankle and foot. 2. Right midfoot degenerative changes. Electronically Signed   By: Tish Frederickson M.D.   On: 08/28/2020 01:01    Procedures Procedures   Medications Ordered in ED Medications  ketorolac (TORADOL) 15 MG/ML injection 15 mg (15 mg Intramuscular Given 08/28/20 2637)    ED Course  I have reviewed the triage vital signs and the nursing notes.  Pertinent labs & imaging results that were available during my care of the patient were reviewed by me and considered in my medical decision making (see chart for details).  Adult male presents with pain throughout the foot and ankle following basketball game several days ago.  He is distally neurovascularly intact, is awake, alert, has no other injuries or complaints. X-rays reviewed, discussed, he does have point tenderness in multiple areas, but no evidence  for fracture.  Suspicion for inflammatory changes given his history of prior injuries.  Lower suspicion for gout given the point tenderness, and multiple areas of discomfort. Had no evidence for compartment syndrome given the soft foot, absence of pain with passive range of motion.  Patient had immobilization, crutches, initiated anti-inflammatories, analgesics, will follow up with orthopedics. Final Clinical Impression(s) / ED Diagnoses Final diagnoses:  Acute leg pain, right    Rx / DC Orders ED Discharge Orders         Ordered    predniSONE (DELTASONE) 20 MG tablet  Daily with breakfast        08/28/20 0853    HYDROcodone-acetaminophen (NORCO/VICODIN) 5-325 MG tablet  Every 6 hours PRN        08/28/20 0853           Gerhard Munch, MD 08/28/20 [phone]

## 2020-08-28 NOTE — Discharge Instructions (Signed)
As discussed, your foot and ankle pain is likely due to inflammatory changes following the basketball game.  It is important to take all medication as directed, and follow-up with our orthopedist.  In the interim, please use the provided immobilization device, crutches, to minimize weightbearing.

## 2020-08-28 NOTE — ED Provider Notes (Signed)
  Emergency Medicine Provider in Triage Note   MSE was initiated and I personally evaluated the patient  12:30 AM on Aug 28, 2020 as provider in triage.   Chief Complaint: R ankle pain  HPI  Patient is a 50 y.o. who presents to the ED with complaints of right foot/anke pain S/p basketball game 5 days prior. No specific injury, hurt the following day, constant worse with  Movement, no alleviating factors.    Review of Systems  Positive: Arthralgias, joint swelling Negative: Numbness, weakness  Physical Exam  BP 138/90 (BP Location: Left Arm)   Pulse 92   Temp 99.3 F (37.4 C) (Oral)   Resp 18   Ht 6\' 2"  (1.88 m)   Wt (!) 142.9 kg   SpO2 98%   BMI 40.44 kg/m    Gen:   Awake, no distress   HEENT:  Atraumatic  Resp:  Normal effort Cardiac:  Normal rate, 2+ DP pulse to RLE Abd:   Nondistended, nontender  MSK:   RLE: R ankle and foot are swollen. Tender to medial/lateral malleolus, mid & forefoot. Neuro:  Speech clear, sensation grossly intact to the RLE  Medical Decision Making   Initiation of care has begun. The patient has been counseled on the process, plan, and necessity for staying for the completion/evaluation, informed that the remainder of the evaluation will be completed by another provider, this initial triage assessment does not replace that evaluation, and the importance of remaining in the ED until their evaluation is complete.  X-rays ordered  Clinical Impression  Right ankle pain.         , PA-C 08/28/20 0033    08/30/20, MD 08/28/20 [phone]

## 2020-08-28 NOTE — ED Triage Notes (Signed)
Patient reports R ankle pain after playing basketball, denies injury that he remembers

## 2021-11-21 IMAGING — DX DG FOOT COMPLETE 3+V*R*
3 series · 3 of 3 positions shown · non-contrast
Comparison: None.

CLINICAL DATA: Patient reports R ankle pain after playing
basketball, denies injury that he remembers

EXAM:
RIGHT ANKLE - COMPLETE 3+ VIEW; RIGHT FOOT COMPLETE - 3+ VIEW

[foot ap]
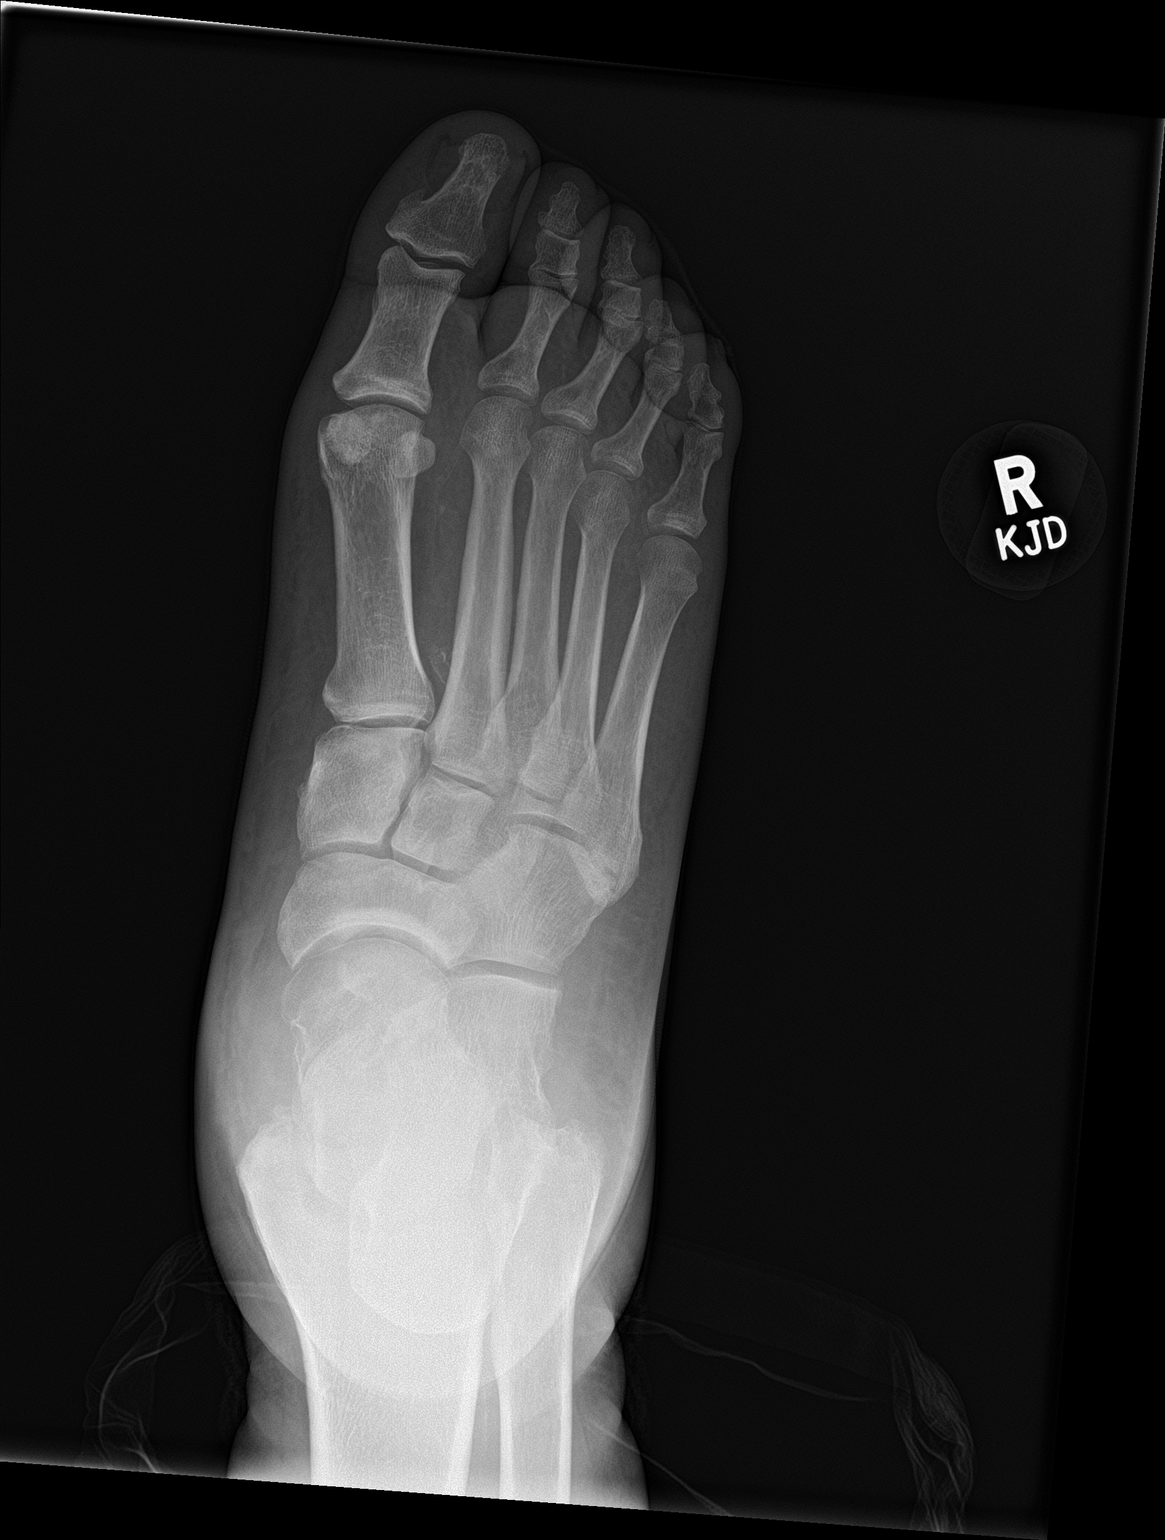

[foot obl]
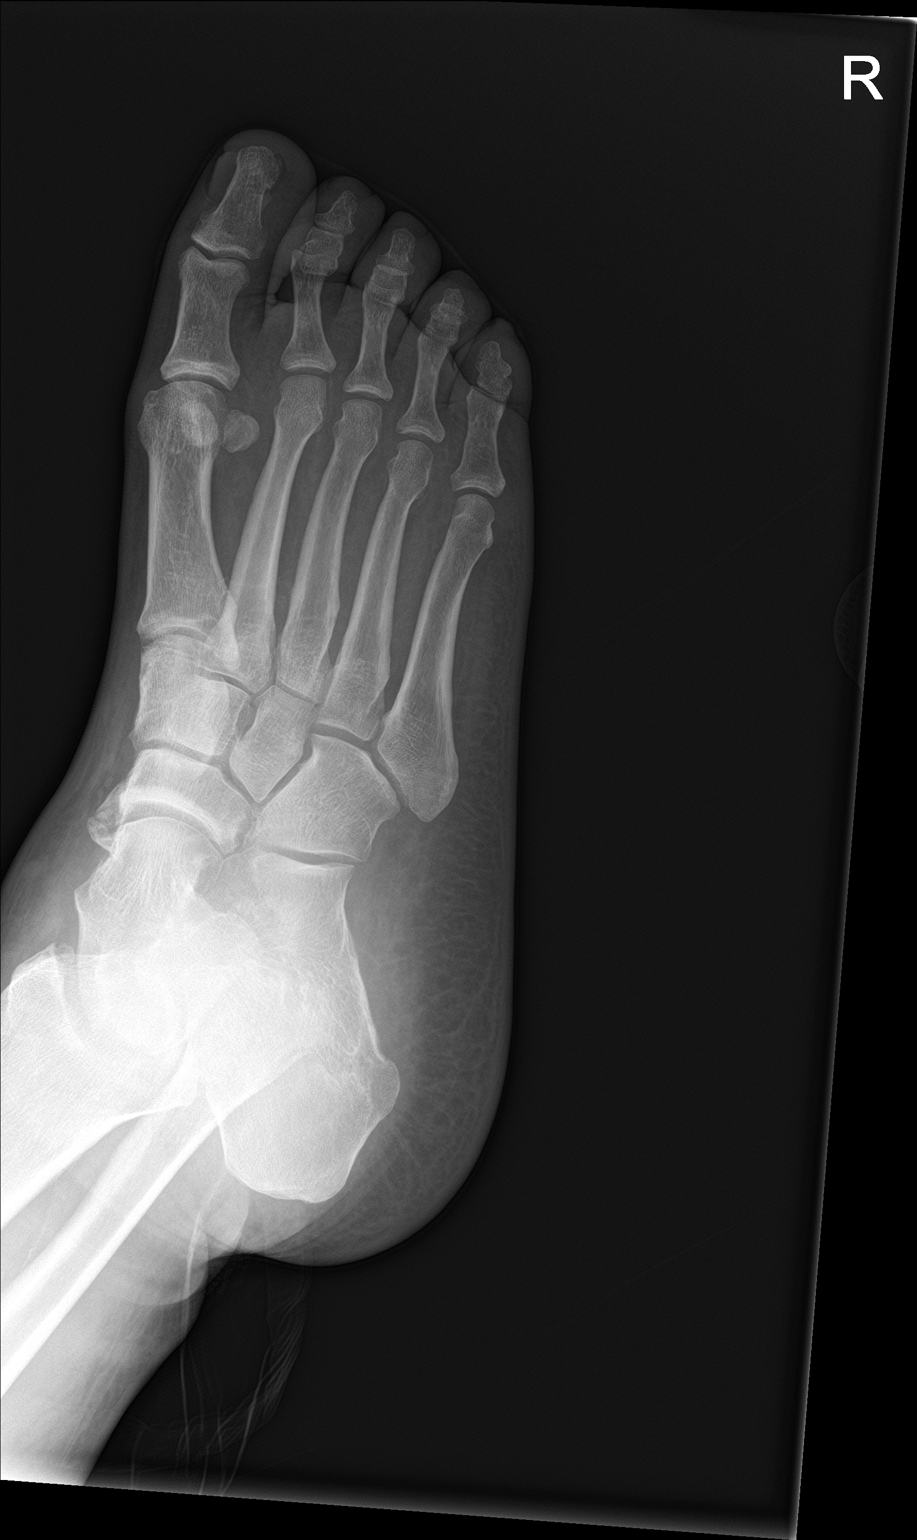

[foot lat]
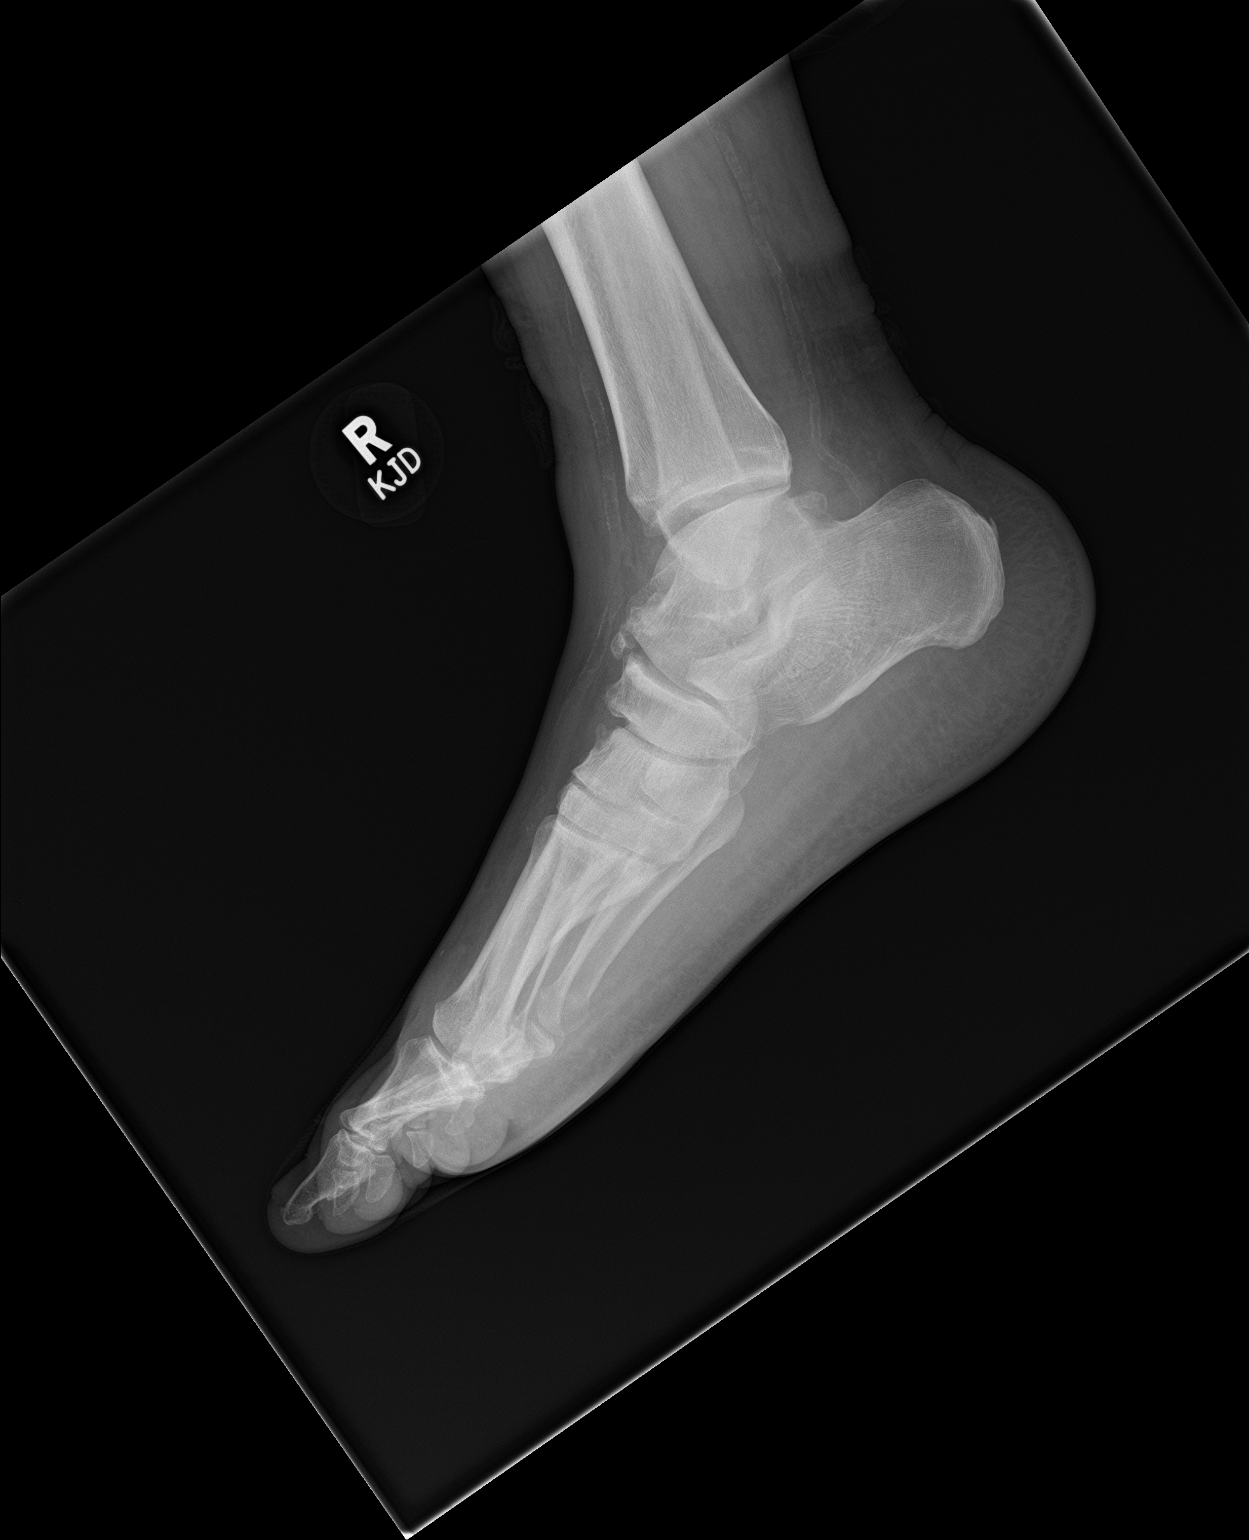

[3 of 3 positions shown; findings below may reference images not displayed]

FINDINGS: Right foot: There is no evidence of fracture, dislocation.
Talonavicular as well as medial cuneiform degenerative changes.
There is no evidence of arthropathy or other focal bone abnormality.
Soft tissues are unremarkable. Vascular calcifications.

Right ankle: No evidence of fracture, dislocation, or joint
effusion. Old medial and lateral malleolar fractures. No evidence of
severe arthropathy. No aggressive appearing focal bone abnormality.
Soft tissues are unremarkable.
IMPRESSION: 1. No acute displaced fracture or dislocation of the right ankle and
foot.
2. Right midfoot degenerative changes.

## 2023-11-05 ENCOUNTER — Emergency Department (HOSPITAL_COMMUNITY)
Admission: EM | Admit: 2023-11-05 | Discharge: 2023-11-06 | Disposition: A | Discharge status: Home / Self Care | Attending: Emergency Medicine | Admitting: Emergency Medicine

## 2023-11-05 ENCOUNTER — Encounter (HOSPITAL_COMMUNITY): Payer: Self-pay

## 2023-11-05 DIAGNOSIS — Z794 Long term (current) use of insulin: Secondary | ICD-10-CM | POA: Diagnosis not present

## 2023-11-05 DIAGNOSIS — I10 Essential (primary) hypertension: Secondary | ICD-10-CM | POA: Insufficient documentation

## 2023-11-05 DIAGNOSIS — Z7984 Long term (current) use of oral hypoglycemic drugs: Secondary | ICD-10-CM | POA: Insufficient documentation

## 2023-11-05 DIAGNOSIS — M109 Gout, unspecified: Secondary | ICD-10-CM | POA: Insufficient documentation

## 2023-11-05 DIAGNOSIS — Z79899 Other long term (current) drug therapy: Secondary | ICD-10-CM | POA: Diagnosis not present

## 2023-11-05 DIAGNOSIS — J45909 Unspecified asthma, uncomplicated: Secondary | ICD-10-CM | POA: Insufficient documentation

## 2023-11-05 DIAGNOSIS — Z7982 Long term (current) use of aspirin: Secondary | ICD-10-CM | POA: Diagnosis not present

## 2023-11-05 DIAGNOSIS — E119 Type 2 diabetes mellitus without complications: Secondary | ICD-10-CM | POA: Diagnosis not present

## 2023-11-05 DIAGNOSIS — M2540 Effusion, unspecified joint: Secondary | ICD-10-CM

## 2023-11-05 DIAGNOSIS — R7989 Other specified abnormal findings of blood chemistry: Secondary | ICD-10-CM | POA: Diagnosis present

## 2023-11-05 LAB — CBC WITH DIFFERENTIAL/PLATELET
Abs Immature Granulocytes: 0.04 K/uL (ref 0.00–0.07)
Basophils Absolute: 0.1 K/uL (ref 0.0–0.1)
Basophils Relative: 1 %
Eosinophils Absolute: 0.2 K/uL (ref 0.0–0.5)
Eosinophils Relative: 2 %
HCT: 37.2 % — ABNORMAL LOW (ref 39.0–52.0)
Hemoglobin: 12.2 g/dL — ABNORMAL LOW (ref 13.0–17.0)
Immature Granulocytes: 0 %
Lymphocytes Relative: 29 %
Lymphs Abs: 3 K/uL (ref 0.7–4.0)
MCH: 27.9 pg (ref 26.0–34.0)
MCHC: 32.8 g/dL (ref 30.0–36.0)
MCV: 84.9 fL (ref 80.0–100.0)
Monocytes Absolute: 0.9 K/uL (ref 0.1–1.0)
Monocytes Relative: 9 %
Neutro Abs: 6 K/uL (ref 1.7–7.7)
Neutrophils Relative %: 59 %
Platelets: 494 K/uL — ABNORMAL HIGH (ref 150–400)
RBC: 4.38 MIL/uL (ref 4.22–5.81)
RDW: 14.5 % (ref 11.5–15.5)
WBC: 10.1 K/uL (ref 4.0–10.5)
nRBC: 0 % (ref 0.0–0.2)

## 2023-11-05 LAB — COMPREHENSIVE METABOLIC PANEL WITH GFR
ALT: 23 U/L (ref 0–44)
AST: 17 U/L (ref 15–41)
Albumin: 3.3 g/dL — ABNORMAL LOW (ref 3.5–5.0)
Alkaline Phosphatase: 82 U/L (ref 38–126)
Anion gap: 15 (ref 5–15)
BUN: 34 mg/dL — ABNORMAL HIGH (ref 6–20)
CO2: 25 mmol/L (ref 22–32)
Calcium: 9.3 mg/dL (ref 8.9–10.3)
Chloride: 93 mmol/L — ABNORMAL LOW (ref 98–111)
Creatinine, Ser: 1.86 mg/dL — ABNORMAL HIGH (ref 0.61–1.24)
GFR, Estimated: 43 mL/min — ABNORMAL LOW (ref >60)
Glucose, Bld: 144 mg/dL — ABNORMAL HIGH (ref 70–99)
Potassium: 3.2 mmol/L — ABNORMAL LOW (ref 3.5–5.1)
Sodium: 133 mmol/L — ABNORMAL LOW (ref 135–145)
Total Bilirubin: 0.6 mg/dL (ref 0.0–1.2)
Total Protein: 8.3 g/dL — ABNORMAL HIGH (ref 6.5–8.1)

## 2023-11-05 LAB — I-STAT CHEM 8, ED
BUN: 32 mg/dL — ABNORMAL HIGH (ref 6–20)
Calcium, Ion: 1.12 mmol/L — ABNORMAL LOW (ref 1.15–1.40)
Chloride: 93 mmol/L — ABNORMAL LOW (ref 98–111)
Creatinine, Ser: 2 mg/dL — ABNORMAL HIGH (ref 0.61–1.24)
Glucose, Bld: 149 mg/dL — ABNORMAL HIGH (ref 70–99)
HCT: 39 % (ref 39.0–52.0)
Hemoglobin: 13.3 g/dL (ref 13.0–17.0)
Potassium: 3.1 mmol/L — ABNORMAL LOW (ref 3.5–5.1)
Sodium: 134 mmol/L — ABNORMAL LOW (ref 135–145)
TCO2: 24 mmol/L (ref 22–32)

## 2023-11-05 LAB — I-STAT VENOUS BLOOD GAS, ED
Acid-Base Excess: 4 mmol/L — ABNORMAL HIGH (ref 0.0–2.0)
Bicarbonate: 27.1 mmol/L (ref 20.0–28.0)
Calcium, Ion: 1.11 mmol/L — ABNORMAL LOW (ref 1.15–1.40)
HCT: 38 % — ABNORMAL LOW (ref 39.0–52.0)
Hemoglobin: 12.9 g/dL — ABNORMAL LOW (ref 13.0–17.0)
O2 Saturation: 86 %
Potassium: 3.1 mmol/L — ABNORMAL LOW (ref 3.5–5.1)
Sodium: 133 mmol/L — ABNORMAL LOW (ref 135–145)
TCO2: 28 mmol/L (ref 22–32)
pCO2, Ven: 36.6 mmHg — ABNORMAL LOW (ref 44–60)
pH, Ven: 7.479 — ABNORMAL HIGH (ref 7.25–7.43)
pO2, Ven: 47 mmHg — ABNORMAL HIGH (ref 32–45)

## 2023-11-05 LAB — MAGNESIUM: Magnesium: 2.4 mg/dL (ref 1.7–2.4)

## 2023-11-05 LAB — BETA-HYDROXYBUTYRIC ACID: Beta-Hydroxybutyric Acid: 0.26 mmol/L (ref 0.05–0.27)

## 2023-11-05 NOTE — Progress Notes (Signed)
 Dylan Patton presents for  Chief Complaint  Patient presents with  . Swelling    SUBJECTIVE  Dylan Patton  presents today with bilateral ankle and knee swelling X one week. He also has pain and swelling to right hand Patient reported that the swelling began when he was at the beach on Monday of last week.  He complains of pain on a scale 8/10.  Patient presented today with a cane as he reported weakness on the left side.  He reports that he has lost weight unintentionally. He has lost 27lbs since 6/2 States he is feeling a little weak. Is concerned as he feels like his body is breaking down rapidly.     Allergies[1]  Current Medications[2]  The following portions of the patient's history were reviewed and updated as appropriate: allergies, current medications, past medical history, past social history, past family history, and problem list.   ROS   See HPI.  OBJECTIVE  BP 133/76   Pulse 96   Temp 98.6 F (37 C) (Oral)   Resp 20   Ht 1.88 m (6' 2)   Wt 128 kg (283 lb)   SpO2 97%   BMI 36.34 kg/m   GENERAL APPEARANCE: Well appearing, well developed, no acute distress. LUNGS: Clear to auscultation bilaterally HEART: Regular rate and rhythm without murmur. ABDOMEN: Normal bowel sounds, soft, non-tender, without hepato-splenomegaly EXTREMITIES: No edema NEUROLOGIC: Alert and oriented x3, no obvious deficits.  Physical Exam  Musculoskeletal:     Left hand: Swelling and tenderness present. Decreased range of motion.     Left knee: Deformity present.     Right lower leg: 2+ Pitting Edema present.     Left lower leg: 2+ Pitting Edema present.     Comments: States knot appeared on knee 2 days ago     ASSESSMENT/PLAN  1. Acute pain of left knee (Primary) - XR Knee 3 Views Left; Future - Rheumatoid Factor  2. Leg swelling - CBC with Differential - Comprehensive Metabolic Panel - C-Reactive Protein (CRP) - Sedimentation Rate (ESR) - Uric Acid - Antinuclear Antibody,  Hep-2 Substrate,IgG - D-Dimer, Quantitative - B-Type Natriuretic Peptide (BNP) - Rheumatoid Factor Encouraged compression socks and elevation   3. Weight loss - CBC with Differential - Comprehensive Metabolic Panel - C-Reactive Protein (CRP) - Sedimentation Rate (ESR) - Uric Acid - Antinuclear Antibody, Hep-2 Substrate,IgG - D-Dimer, Quantitative - B-Type Natriuretic Peptide (BNP) Will assess labs. Denies CP, SOB, and abdominal pain   4. Type 2 diabetes mellitus with stage 3a chronic kidney disease, with long-term current use of insulin    (CMD) - CBC with Differential - Comprehensive Metabolic Panel - C-Reactive Protein (CRP) - Sedimentation Rate (ESR) - Uric Acid - Antinuclear Antibody, Hep-2 Substrate,IgG - D-Dimer, Quantitative - B-Type Natriuretic Peptide (BNP) Unable to assess A1C. Will assess other labs to determine cause   5. Essential (primary) hypertension   Return in about 2 weeks (around 11/19/2023) for Recheck swelling.  This document serves as a record of services personally performed by Sheppard Ee, FNP.  It was created on their behalf by Ileana Riggs, CMA, a trained medical scribe, and Certified Medical Assistant (CMA). During the course of documenting the history, physical exam and medical decision making, I was functioning as a Stage manager. The creation of this record is the provider's dictation and/or activities during the visit.  Electronically signed by Ileana Riggs, CMA 11/05/2023 5:25 PM     I agree the documentation is accurate and complete.  Electronically  signed by: Vergil Marko Ee, FNP 11/05/2023 5:25 PM         [1] Allergies Allergen Reactions  . Penicillin Itching and Other (See Comments)    Reaction: Sweating (intolerance)   . Lisinopril Swelling  [2]  Current Outpatient Medications:  .  allopurinoL (ZYLOPRIM) 100 mg tablet, TAKE 1 TABLET BY MOUTH DAILY, Disp: 90 tablet, Rfl: 1 .  amLODIPine  (NORVASC ) 10 mg tablet, Take  one tablet (10 mg total) by mouth daily., Disp: 90 tablet, Rfl: 1 .  ascorbic acid (VITAMIN C) 500 mg tablet, Take 500 mg by mouth Once Daily., Disp: , Rfl:  .  aspirin  81 mg EC tablet, Take 1 tablet 81mg  twice daily X 6 weeks, Disp: 84 tablet, Rfl: 0 .  atorvastatin (LIPITOR) 20 mg tablet, TAKE 1 TABLET BY MOUTH DAILY, Disp: 90 tablet, Rfl: 3 .  blood-glucose meter misc, 1 each by miscellaneous route 3 (three) times a day., Disp: 1 each, Rfl: 0 .  blood-glucose meter,receiver,continuous (Dexcom G6 Receiver), Use with Dexcom G6 sensor, Disp: 1 each, Rfl: 1 .  blood-glucose sensor (Dexcom G6 Sensor), Insert 1 sensor into the skin every 10 days, Disp: 9 each, Rfl: 3 .  blood-glucose transmitter device (Dexcom G6 Transmitter), Use with Dexcom G6 sensor. Change every 3 months., Disp: 1 each, Rfl: 3 .  chlorthalidone (HYGROTON) 50 mg tablet, TAKE 1 TABLET BY MOUTH DAILY, Disp: 90 tablet, Rfl: 3 .  cholecalciferol (VITAMIN D3) 2,000 unit tablet, Take 2,000 Units by mouth Once Daily., Disp: , Rfl:  .  diclofenac sodium (VOLTAREN) 1 % gel, Apply 1 g topically 4 (four) times a day., Disp: 50 g, Rfl: 2 .  empagliflozin (Jardiance) 25 mg tab, Take 1 tablet (25 mg total) by mouth daily., Disp: 30 tablet, Rfl: 1 .  ferrous sulfate 325 mg (65 mg iron) tablet, Take one tablet by mouth on Monday, Wednesday, and Friday, weekly, Disp: , Rfl:  .  gabapentin (NEURONTIN) 300 mg capsule, Take 1 capsule (300 mg total) by mouth 3 (three) times a day., Disp: 270 capsule, Rfl: 3 .  glipiZIDE (GLUCOTROL) 10 mg tablet, Take 1 tablet (10 mg total) by mouth 2 (two) times a day., Disp: 60 tablet, Rfl: 1 .  glucose blood (Precision Q-I-D Test) test strip, USE 1 STRIP 2 TO 3 TIMES DAILY, Disp: 200 strip, Rfl: 5 .  insulin glargine U-300 (Toujeo Max U-300 SoloStar) 300 unit/mL (3 mL) pen pen, Inject 48 units daily, Disp: 6 mL, Rfl: 11 .  metFORMIN (GLUCOPHAGE) 1,000 mg tablet, Take 1 tablet (1,000 mg total) by mouth in the morning  and 1 tablet (1,000 mg total) in the evening. Take with meals., Disp: 180 tablet, Rfl: 3 .  multivitamin (THERAGRAN) tab tablet, Take 1 tablet by mouth Once Daily., Disp: , Rfl:  .  ondansetron  (ZOFRAN ) 4 mg tablet, Take 1 tablet (4 mg total) by mouth every 8 (eight) hours as needed for nausea or vomiting., Disp: 30 tablet, Rfl: 0 .  pen needle, diabetic (Droplet Pen Needle) 31 gauge x 5/16 ndle, USE TO ADMINISTER INSULIN TWO TIMES A DAY AS DIRECTED, Disp: 200 each, Rfl: 5 .  semaglutide (Ozempic) 2 mg/dose (8 mg/3 mL) subcutaneous pen injector, Inject 0.75 mL (2 mg total) under the skin once a week. Dx E 11.9, Disp: 3 mL, Rfl: 11

## 2023-11-05 NOTE — ED Triage Notes (Signed)
 Pt states that he went to see his PCP due to joint swelling and was told he had abnormal labs and elevated kidney levels.

## 2023-11-05 NOTE — ED Provider Triage Note (Signed)
 Emergency Medicine Provider Triage Evaluation Note  Dylan Patton , a 53 y.o. male  was evaluated in triage.  Pt complains of abnormal labs.  Kidney function is worsening.  His D-dimer and other inflammatory markers were elevated.  Endorses swelling to his arms and legs.  Denies any chest pain, shortness of breath. D-dimer was 1300 Again he denies any chest pain or shortness of breath. PCP was also worried about DKA. Dylan Patton  Review of Systems  Positive: As above Negative: As above  Physical Exam  There were no vitals taken for this visit. Gen:   Awake, no distress   Resp:  Normal effort  MSK:   Moves extremities without difficulty  Other:    Medical Decision Making  Medically screening exam initiated at 8:36 PM.  Appropriate orders placed.  Dylan Patton was informed that the remainder of the evaluation will be completed by another provider, this initial triage assessment does not replace that evaluation, and the importance of remaining in the ED until their evaluation is complete.  Labs ordered.  Will defer CT angio given he is not having any chest pain or shortness of breath.    Dylan Loge, PA-C 11/05/23 2037

## 2023-11-06 ENCOUNTER — Emergency Department (HOSPITAL_COMMUNITY)

## 2023-11-06 LAB — URIC ACID: Uric Acid, Serum: 10.5 mg/dL — ABNORMAL HIGH (ref 3.7–8.6)

## 2023-11-06 LAB — TROPONIN I (HIGH SENSITIVITY): Troponin I (High Sensitivity): 11 ng/L (ref <18)

## 2023-11-06 MED ORDER — PREDNISONE 20 MG PO TABS: 40.0000 mg | ORAL_TABLET | Freq: Every day | ORAL | 0 refills | Status: DC

## 2023-11-06 MED ORDER — SODIUM CHLORIDE 0.9 % IV BOLUS
1000.0000 mL | Freq: Once | INTRAVENOUS | Status: AC
  Administered 2023-11-06: 1000 mL via INTRAVENOUS

## 2023-11-06 MED ORDER — POTASSIUM CHLORIDE CRYS ER 20 MEQ PO TBCR
40.0000 meq | EXTENDED_RELEASE_TABLET | Freq: Once | ORAL | Status: AC
  Administered 2023-11-06: 40 meq via ORAL
  Filled 2023-11-06: qty 2

## 2023-11-06 MED ORDER — HYDROCODONE-ACETAMINOPHEN 5-325 MG PO TABS
1.0000 | ORAL_TABLET | Freq: Four times a day (QID) | ORAL | 0 refills | Status: AC | PRN
Start: 2023-11-06 — End: ?

## 2023-11-06 MED ORDER — IOHEXOL 350 MG/ML SOLN
75.0000 mL | Freq: Once | INTRAVENOUS | Status: AC | PRN
  Administered 2023-11-06: 75 mL via INTRAVENOUS

## 2023-11-06 MED ORDER — LIDOCAINE-EPINEPHRINE (PF) 2 %-1:200000 IJ SOLN
20.0000 mL | Freq: Once | INTRAMUSCULAR | Status: DC
  Filled 2023-11-06: qty 20

## 2023-11-06 MED ORDER — PREDNISONE 10 MG (21) PO TBPK
ORAL_TABLET | Freq: Every day | ORAL | 0 refills | Status: AC
Start: 2023-11-06 — End: ?

## 2023-11-06 MED ORDER — PREDNISONE 20 MG PO TABS
60.0000 mg | ORAL_TABLET | Freq: Once | ORAL | Status: AC
  Administered 2023-11-06: 60 mg via ORAL
  Filled 2023-11-06: qty 3

## 2023-11-06 NOTE — ED Provider Notes (Addendum)
 [place] EMERGENCY DEPARTMENT AT [facility] Provider Note   CSN: [id number] Arrival date & time: 11/05/23  1945     Patient presents with: Abnormal Lab   Dylan Patton is a 53 y.o. male with history of anxiety, asthma, cardiac arrhythmia, diabetes, hypertension.  Patient presents to ED for evaluation of abnormal labs.  States that 1 week ago on Monday [place] was [place] the beach.  On Monday of this past week he noticed that his left ankle began swelling.  He states that at that time he began taking allopurinol because as a history of gout.  States that over the course of the week his swelling went up into his left knee and then 2 days ago he noticed that he had a swollen left hand with pain to the lateral portion of his left wrist.  He went to his PCP who drew a myriad of labs to include rheumatoid factor, BNP, D-dimer, uric acid, sedimentation rate, CRP.  Patient rheumatoid factor elevated, BNP normal, D-dimer elevated, uric acid elevated, sedimentation rate elevated, CRP elevated.  Patient sent in for further evaluation.  Patient denies any chest pain, shortness of breath however does have elevated D-dimer.  He reports a history of gout and states that he has tried taking allopurinol without relief.  He denies any fevers, nausea, vomiting, headache, blurred vision, neck pain at home.    Abnormal Lab      Prior to Admission medications   Medication Sig Start Date End Date Taking? Authorizing Provider  HYDROcodone -acetaminophen  (NORCO/VICODIN) 5-325 MG tablet Take 1 tablet by mouth every 6 (six) hours as needed for severe pain (pain score 7-10). 11/06/23  Yes Dametria Tuzzolino F, PA-C  predniSONE  (STERAPRED UNI-PAK 21 TAB) 10 MG (21) TBPK tablet Take by mouth daily. Take 6 tabs by mouth daily  for 2 days, then 5 tabs for 2 days, then 4 tabs for 2 days, then 3 tabs for 2 days, 2 tabs for 2 days, then 1 tab by mouth daily for 2 days 11/06/23  Yes Ruthell Lonni FALCON, PA-C  amLODipine   (NORVASC ) 5 MG tablet Take 1 tablet (5 mg total) by mouth daily. 05/13/18   Henderly, Britni A, PA-C  aspirin  81 MG chewable tablet Chew 81 mg by mouth 2 (two) times daily.     [provider]  diphenhydrAMINE  (BENADRYL ) 25 MG tablet Take 1 tablet (25 mg total) by mouth every 8 (eight) hours for 5 days. 05/13/18 05/18/18  Henderly, Britni A, PA-C  empagliflozin (JARDIANCE) 25 MG TABS tablet Take 25 mg by mouth daily.    [provider]  EPINEPHrine  0.3 mg/0.3 mL IJ SOAJ injection Inject 0.3 mLs (0.3 mg total) into the muscle as needed for anaphylaxis. 05/13/18   Henderly, Britni A, PA-C  glipiZIDE (GLUCOTROL) 10 MG tablet Take 10 mg by mouth 2 (two) times daily before a meal.    [provider]  Insulin Glargine (BASAGLAR KWIKPEN [place]) Inject 45 Units into the skin daily.    [provider]  latanoprost (XALATAN) 0.005 % ophthalmic solution Place 1 drop into both eyes at bedtime.    [provider]  lisinopril (PRINIVIL,ZESTRIL) 40 MG tablet Take 40 mg by mouth daily.    [provider]  metFORMIN (GLUCOPHAGE) 500 MG tablet Take 500 mg by mouth 2 (two) times daily with a meal.    [provider]  ondansetron  (ZOFRAN  ODT) 4 MG disintegrating tablet Take 1 tablet (4 mg total) by mouth every 8 (  eight) hours as needed for nausea. 04/17/13   Jarold Olam HERO, PA-C  pantoprazole  (PROTONIX ) 20 MG tablet Take 1 tablet (20 mg total) by mouth daily. Patient not taking: Reported on 05/13/2018 04/17/13   Jarold Olam HERO, PA-C  simvastatin (ZOCOR) 20 MG tablet Take 20 mg by mouth daily.    [provider]  sitaGLIPtin (JANUVIA) 100 MG tablet Take 100 mg by mouth daily.    [provider]    Allergies: Penicillins    Review of Systems  Respiratory:  Negative for shortness of breath.   Cardiovascular:  Negative for chest pain.  Musculoskeletal:  Positive for joint swelling.  All other systems reviewed and are negative.   Updated Vital  Signs BP (!) 149/79   Pulse 78   Temp 98.7 F (37.1 C) (Oral)   Resp 19   SpO2 100%   Physical Exam Vitals and nursing note reviewed.  Constitutional:      General: He is not in acute distress.    Appearance: He is well-developed.  HENT:     Head: Normocephalic and atraumatic.  Eyes:     Conjunctiva/sclera: Conjunctivae normal.  Neck:     Comments: No meningismus Cardiovascular:     Rate and Rhythm: Normal rate and regular rhythm.     Heart sounds: No murmur heard. Pulmonary:     Effort: Pulmonary effort is normal. No respiratory distress.     Breath sounds: Normal breath sounds.  Abdominal:     Palpations: Abdomen is soft.     Tenderness: There is no abdominal tenderness.     Comments: No tenderness to patient abdomen  Musculoskeletal:        General: No swelling.     Cervical back: Neck supple.     Comments: Soft tissue swelling noted to the left ankle, left knee.  Left hand swollen.  Left ankle is able to be ranged without pain.  There is no overlying erythema, warmth.  Left knee also with appropriate range of motion without pain, no overlying erythema or warmth.  Skin:    General: Skin is warm and dry.     Capillary Refill: Capillary refill takes less than 2 seconds.  Neurological:     Mental Status: He is alert and oriented to person, place, and time. Mental status is at baseline.  Psychiatric:        Mood and Affect: Mood normal.     (all labs ordered are listed, but only abnormal results are displayed) Labs Reviewed  CBC WITH DIFFERENTIAL/PLATELET - Abnormal; Notable for the following components:      Result Value   Hemoglobin 12.2 (*)    HCT 37.2 (*)    Platelets 494 (*)    All other components within normal limits  COMPREHENSIVE METABOLIC PANEL WITH GFR - Abnormal; Notable for the following components:   Sodium 133 (*)    Potassium 3.2 (*)    Chloride 93 (*)    Glucose, Bld 144 (*)    BUN 34 (*)    Creatinine, Ser 1.86 (*)    Total Protein 8.3 (*)     Albumin 3.3 (*)    GFR, Estimated 43 (*)    All other components within normal limits  URIC ACID - Abnormal; Notable for the following components:   Uric Acid, Serum 10.5 (*)    All other components within normal limits  I-STAT VENOUS BLOOD GAS, ED - Abnormal; Notable for the following components:   pH, Ven 7.479 (*)  pCO2, Ven 36.6 (*)    pO2, Ven 47 (*)    Acid-Base Excess 4.0 (*)    Sodium 133 (*)    Potassium 3.1 (*)    Calcium, Ion 1.11 (*)    HCT 38.0 (*)    Hemoglobin 12.9 (*)    All other components within normal limits  I-STAT CHEM 8, ED - Abnormal; Notable for the following components:   Sodium 134 (*)    Potassium 3.1 (*)    Chloride 93 (*)    BUN 32 (*)    Creatinine, Ser 2.00 (*)    Glucose, Bld 149 (*)    Calcium, Ion 1.12 (*)    All other components within normal limits  CULTURE, BLOOD (ROUTINE X 2)  CULTURE, BLOOD (ROUTINE X 2)  MAGNESIUM  BETA-HYDROXYBUTYRIC ACID  TROPONIN I (HIGH SENSITIVITY)    EKG: None  Radiology: DG Knee Left Port Result Date: 11/06/2023 CLINICAL DATA:  Left knee pain and swelling, initial encounter EXAM: PORTABLE LEFT KNEE - 1-2 VIEW COMPARISON:  None Available. FINDINGS: Mild medial joint space narrowing is noted. No acute fracture or dislocation is noted. Bipartite patella is noted. No joint effusion is seen. IMPRESSION: Degenerative changes without acute abnormality. Electronically Signed   By: Oneil Devonshire M.D.   On: 11/06/2023 01:59   DG Wrist Complete Left Result Date: 11/06/2023 CLINICAL DATA:  Left wrist pain and swelling, initial encounter EXAM: LEFT WRIST - COMPLETE 3+ VIEW COMPARISON:  None Available. FINDINGS: No acute fracture or dislocation is noted. Mild degenerative changes of the radiocarpal joint are seen. No gross soft tissue abnormality is seen. Degenerative changes in the first [facility] joint are noted as well. IMPRESSION: Degenerative change without acute abnormality. Electronically Signed   By: Oneil Devonshire M.D.    On: 11/06/2023 01:53   DG Ankle Left Port Result Date: 11/06/2023 CLINICAL DATA:  Left ankle pain and swelling, initial encounter EXAM: PORTABLE LEFT ANKLE - 2 VIEW COMPARISON:  None Available. FINDINGS: Generalized soft tissue swelling is noted about the ankle joint. No acute fracture or dislocation is noted. Chronic degenerative changes in the tarsal bones and tibiotalar joint are seen. IMPRESSION: Generalized soft tissue swelling without acute bony abnormality. Electronically Signed   By: Oneil Devonshire M.D.   On: 11/06/2023 01:51   CT Angio Chest PE W and/or Wo Contrast Result Date: 11/06/2023 CLINICAL DATA:  Chest pain, initial encounter EXAM: CT ANGIOGRAPHY CHEST WITH CONTRAST TECHNIQUE: Multidetector CT imaging of the chest was performed using the standard protocol during bolus administration of intravenous contrast. Multiplanar CT image reconstructions and MIPs were obtained to evaluate the vascular anatomy. RADIATION DOSE REDUCTION: This exam was performed according to the departmental dose-optimization program which includes automated exposure control, adjustment of the mA and/or kV according to patient size and/or use of iterative reconstruction technique. CONTRAST:  75mL OMNIPAQUE  IOHEXOL  350 MG/ML SOLN COMPARISON:  None Available. FINDINGS: Cardiovascular: Thoracic aorta is well visualized without aneurysmal dilatation. No dissection is noted. The pulmonary artery shows a normal branching pattern bilaterally. No intraluminal filling defect is identified. No cardiac enlargement is seen. Mediastinum/Nodes: Thoracic inlet is within normal limits. No hilar or mediastinal adenopathy is noted. The esophagus as visualized is within normal limits. Lungs/Pleura: Lungs are well aerated bilaterally. Multiple small less than 3 mm nodules are noted scattered throughout both lungs primarily in the lower lobes. No sizable nodule is seen. No infiltrate or effusion is noted. Upper Abdomen: Visualized upper abdomen  is within normal limits. Musculoskeletal: No  chest wall abnormality. No acute or significant osseous findings. Review of the MIP images confirms the above findings. IMPRESSION: Scattered tiny less than 3 mm pulmonary nodules are noted bilaterally. Per Fleischner Society Guidelines, no routine follow-up imaging is recommended. These guidelines do not apply to immunocompromised patients and patients with cancer. Follow up in patients with significant comorbidities as clinically warranted. For lung cancer screening, adhere to Lung-RADS guidelines. Reference: Radiology. 2017; 284(1):228-43. No evidence of pulmonary emboli. No other focal abnormality is noted. Electronically Signed   By: Oneil Devonshire M.D.   On: 11/06/2023 01:29     Procedures   Medications Ordered in the ED  sodium chloride  0.9 % bolus 1,000 mL (0 mLs Intravenous Stopped 11/06/23 0231)  iohexol  (OMNIPAQUE ) 350 MG/ML injection 75 mL (75 mLs Intravenous Contrast Given 11/06/23 0118)  predniSONE  (DELTASONE ) tablet 60 mg (60 mg Oral Given 11/06/23 0236)  potassium chloride  SA (KLOR-CON  M) CR tablet 40 mEq (40 mEq Oral Given 11/06/23 0303)    Medical Decision Making Amount and/or Complexity of Data Reviewed Labs: ordered. Radiology: ordered.  Risk Prescription drug management.   53 year old male presents to the ED for evaluation of abnormal labs.  Patient apparently has had joint swelling to left ankle, left knee and left hand over the last 1 week.  He was seen by his PCP yesterday who drew labs.  Patient uric acid, sedimentation rate, CRP, D-dimer markedly elevated.  Patient did not for further care.  On examination the patient is afebrile and nontachycardic.  His lung sounds are clear bilaterally, he is not hypoxic.  His abdomen is soft and compressible, neurological examination at baseline.  His left ankle is markedly swollen however he is able to appropriately range his left ankle without pain.  There is no overlying erythema or  warmth.  His left knee is swollen however his range of motion is intact without pain, no erythema or warmth.  He is overall nontoxic in appearance.  He is in no apparent distress.  Patient labs reviewed.  Patient D-dimers markedly elevated.  Will assess with CBC, CMP, magnesium, beta-hydroxybutyrate acid, troponin, i-STAT Chem-8, i-STAT VBG, x-ray of left wrist, left knee, left ankle.  CT angiogram of chest due to elevated D-dimer.  Will also add on uric acid.  Patient was concerned about creatinine being elevated.  Patient creatinine 5 years ago was 1.83.  Tonight the patient's creatinine is 1.86.  Patient creatinine at PCP office yesterday was 1.98.  Creatinine appears to be at baseline.  He was given 1 L of fluid and a CTA was obtained.  CTA is negative for PE or other kind of acute process.  Patient CMP does show sodium 133, potassium 3.2.  Repleted with 40 mEq oral potassium.  GFR 43.  Magnesium within normal limits at 2.4.  Troponin 11.  Beta-hydroxybutyrate acid 0.26.  I-STAT VBG unremarkable.  I-STAT Chem-8 unremarkable.  CBC without leukocytosis, baseline hemoglobin.  Uric acid elevated at 10.5.  At this time, after discussion with attending, we feel patient presentation and elevated inflammatory markers could be secondary to gout.  No evidence of PE.  No evidence of septic arthritis.  Patient able to range all joints without pain.  There is no erythema or warmth.  He has no white count.  Patient was offered arthrocentesis left ankle however defers at this time.  Patient concerned about introducing infection into joint space.  I feel this is reasonable.  Patient presentation so far is not concerning for septic arthritis.  At  this time, patient reports has been taking allopurinol.  This is insulting the kidneys.  Patient advised to discontinue use of allopurinol.  Will start patient on prednisone  taper and sent him home with pain medication.  He will be advised to follow-up with his PCP.  He is  able to ambulate.  He is discharged in stable condition.   Final diagnoses:  Joint swelling  Gout of multiple sites, unspecified cause, unspecified chronicity    ED Discharge Orders          Ordered    predniSONE  (DELTASONE ) 20 MG tablet  Daily,   Status:  Discontinued        11/06/23 0316    HYDROcodone -acetaminophen  (NORCO/VICODIN) 5-325 MG tablet  Every 6 hours PRN        11/06/23 0316    predniSONE  (STERAPRED UNI-PAK 21 TAB) 10 MG (21) TBPK tablet  Daily        11/06/23 0320                Ruthell Lonni FALCON, PA-C 11/06/23 0408    Carita Senior, MD 11/06/23 [id number]    Carita Senior, MD 11/06/23 [phone]

## 2023-11-06 NOTE — ED Provider Notes (Signed)
 Patient sent from PCP with abnormal labs.  He apparently had left ankle swelling for the past week and then swelling to his left knee and left hand and wrist.  Multiple labs were drawn by his PCP and he was sent to the ED for abnormal results.  He is not certain what was abnormal but says his kidney function was elevated and D-dimer was elevated.  He does have a history of gout in his right ankle.  He denies any trauma.  No fever.  No chest pain or shortness of breath.  He states he has not had arthrocentesis previously.  Complains more swelling in his joints rather than pain.  Outpatient labs of elevated ESR, CRP, uric acid.  Creatinine is slower to his baseline in 2020.  Patient appears well and nontoxic.  He is afebrile.  There is soft tissue swelling noted to left ankle, left knee and left wrist.  He is able to range these joints without difficulty.  There is no overlying warmth or erythema.  Intact DP and PT pulse.  Labs show no leukocytosis.  No fever.  Creatinine is near his baseline.  Outpatient labs show elevated uric acid as well as ESR and CRP.  Discussed gout is likely the cause of his polyarthralgia.  Discussed cannot rule out septic joint without performing arthrocentesis.  However would not suspect him to have multiple septic joint simultaneously.  The joints are not warm or erythematous and he is able to range them.  Offered arthrocentesis of left ankle but patient declines at this time.  Discussed risks and benefits but patient does not want to take the risk of introducing infection or damaging surrounding structures.  This seems reasonable.  Favor gout causing his polyarthralgia but arthrocentesis would be performed only given his elevated ESR and CRP.  Will treat with steroids and pain control.  Avoid NSAIDs given kidney function. Discussed cannot rule out septic joint without performing arthrocentesis.  Patient understands this.  Will treat for a result of gout at this time.   Follow-up with his PCP as well as orthopedist.  May benefit from rheumatology evaluation as well given his multiple areas of joint pain.   Carita Senior, MD 11/06/23 [phone]

## 2023-11-06 NOTE — Discharge Instructions (Signed)
 It was a pleasure taking part in your care.  As we discussed, I believe your symptoms are secondary to a gout flare.  Please begin taking steroid taper by following directions on package.  You may take hydrocodone  every 6 hours as needed for pain.  Do not take this and drive, operate machinery or mix with alcohol.  Follow-up with your primary care doctor.  Please return to the ED with any new or worsening symptoms such as fevers or increased pain to your left ankle.

## 2023-11-11 LAB — CULTURE, BLOOD (ROUTINE X 2)
Culture: NO GROWTH
Culture: NO GROWTH
